# Patient Record
Sex: Female | Born: 1945
Health system: Southern US, Community
[De-identification: ages and names within clinical notes are randomized; demographics above are authoritative.]

## PROBLEM LIST (undated history)

## (undated) DIAGNOSIS — I10 Essential (primary) hypertension: Secondary | ICD-10-CM

## (undated) DIAGNOSIS — E785 Hyperlipidemia, unspecified: Secondary | ICD-10-CM

## (undated) HISTORY — DX: Hyperlipidemia, unspecified: E78.5

## (undated) HISTORY — DX: Essential (primary) hypertension: I10

---

## 1968-08-10 HISTORY — PX: GANGLION CYST EXCISION: SHX1691

## 1993-08-10 HISTORY — PX: OVARY SURGERY: SHX727

## 1993-08-10 HISTORY — PX: BLADDER SURGERY: SHX569

## 1998-04-22 ENCOUNTER — Other Ambulatory Visit: Admission: RE | Admit: 1998-04-22 | Discharge: 1998-04-22 | Payer: Self-pay | Admitting: Gynecology

## 1998-12-26 ENCOUNTER — Encounter: Payer: Self-pay | Admitting: Endocrinology

## 1998-12-26 ENCOUNTER — Emergency Department (HOSPITAL_COMMUNITY): Admission: EM | Admit: 1998-12-26 | Discharge: 1998-12-26 | Payer: Self-pay | Admitting: Endocrinology

## 1999-04-21 ENCOUNTER — Other Ambulatory Visit: Admission: RE | Admit: 1999-04-21 | Discharge: 1999-04-21 | Payer: Self-pay | Admitting: Gynecology

## 1999-07-14 ENCOUNTER — Encounter: Admission: RE | Admit: 1999-07-14 | Discharge: 1999-07-14 | Payer: Self-pay | Admitting: Gynecology

## 1999-07-14 ENCOUNTER — Encounter: Payer: Self-pay | Admitting: Gynecology

## 1999-11-24 ENCOUNTER — Other Ambulatory Visit: Admission: RE | Admit: 1999-11-24 | Discharge: 1999-11-24 | Payer: Self-pay | Admitting: Gynecology

## 2000-08-09 ENCOUNTER — Inpatient Hospital Stay (HOSPITAL_COMMUNITY): Admission: EM | Admit: 2000-08-09 | Discharge: 2000-08-11 | Payer: Self-pay | Admitting: Emergency Medicine

## 2000-08-09 ENCOUNTER — Encounter: Payer: Self-pay | Admitting: Emergency Medicine

## 2000-08-10 ENCOUNTER — Encounter: Payer: Self-pay | Admitting: Cardiology

## 2000-08-10 HISTORY — PX: ABDOMINAL HYSTERECTOMY: SHX81

## 2000-08-11 HISTORY — PX: CARDIAC CATHETERIZATION: SHX172

## 2000-11-15 ENCOUNTER — Other Ambulatory Visit: Admission: RE | Admit: 2000-11-15 | Discharge: 2000-11-15 | Payer: Self-pay | Admitting: Gynecology

## 2001-03-07 ENCOUNTER — Encounter: Payer: Self-pay | Admitting: Gastroenterology

## 2001-03-07 ENCOUNTER — Ambulatory Visit (HOSPITAL_COMMUNITY): Admission: RE | Admit: 2001-03-07 | Discharge: 2001-03-07 | Payer: Self-pay | Admitting: Gastroenterology

## 2001-03-14 ENCOUNTER — Encounter: Payer: Self-pay | Admitting: Gynecology

## 2001-03-14 ENCOUNTER — Encounter: Admission: RE | Admit: 2001-03-14 | Discharge: 2001-03-14 | Payer: Self-pay | Admitting: Gynecology

## 2001-04-04 ENCOUNTER — Ambulatory Visit (HOSPITAL_COMMUNITY): Admission: RE | Admit: 2001-04-04 | Discharge: 2001-04-04 | Payer: Self-pay | Admitting: Gastroenterology

## 2001-04-04 ENCOUNTER — Encounter (INDEPENDENT_AMBULATORY_CARE_PROVIDER_SITE_OTHER): Payer: Self-pay | Admitting: *Deleted

## 2001-04-27 ENCOUNTER — Encounter (INDEPENDENT_AMBULATORY_CARE_PROVIDER_SITE_OTHER): Payer: Self-pay | Admitting: *Deleted

## 2001-04-27 ENCOUNTER — Inpatient Hospital Stay (HOSPITAL_COMMUNITY): Admission: RE | Admit: 2001-04-27 | Discharge: 2001-04-30 | Payer: Self-pay | Admitting: Gynecology

## 2001-06-20 ENCOUNTER — Ambulatory Visit (HOSPITAL_COMMUNITY): Admission: RE | Admit: 2001-06-20 | Discharge: 2001-06-20 | Payer: Self-pay | Admitting: Gastroenterology

## 2001-06-20 ENCOUNTER — Encounter (INDEPENDENT_AMBULATORY_CARE_PROVIDER_SITE_OTHER): Payer: Self-pay | Admitting: Specialist

## 2002-01-30 ENCOUNTER — Other Ambulatory Visit: Admission: RE | Admit: 2002-01-30 | Discharge: 2002-01-30 | Payer: Self-pay | Admitting: Gynecology

## 2003-02-19 ENCOUNTER — Other Ambulatory Visit: Admission: RE | Admit: 2003-02-19 | Discharge: 2003-02-19 | Payer: Self-pay | Admitting: Gynecology

## 2003-04-23 ENCOUNTER — Other Ambulatory Visit: Admission: RE | Admit: 2003-04-23 | Discharge: 2003-04-23 | Payer: Self-pay | Admitting: Gynecology

## 2004-05-01 ENCOUNTER — Other Ambulatory Visit: Admission: RE | Admit: 2004-05-01 | Discharge: 2004-05-01 | Payer: Self-pay | Admitting: Gynecology

## 2004-05-12 ENCOUNTER — Encounter (INDEPENDENT_AMBULATORY_CARE_PROVIDER_SITE_OTHER): Payer: Self-pay | Admitting: *Deleted

## 2004-05-12 ENCOUNTER — Ambulatory Visit (HOSPITAL_COMMUNITY): Admission: RE | Admit: 2004-05-12 | Discharge: 2004-05-12 | Payer: Self-pay | Admitting: Gastroenterology

## 2005-03-02 ENCOUNTER — Other Ambulatory Visit: Admission: RE | Admit: 2005-03-02 | Discharge: 2005-03-02 | Payer: Self-pay | Admitting: Gynecology

## 2005-03-30 ENCOUNTER — Encounter: Admission: RE | Admit: 2005-03-30 | Discharge: 2005-03-30 | Payer: Self-pay | Admitting: Gynecology

## 2005-11-09 ENCOUNTER — Inpatient Hospital Stay (HOSPITAL_COMMUNITY): Admission: AD | Admit: 2005-11-09 | Discharge: 2005-11-09 | Payer: Self-pay | Admitting: Gynecology

## 2006-03-08 ENCOUNTER — Other Ambulatory Visit: Admission: RE | Admit: 2006-03-08 | Discharge: 2006-03-08 | Payer: Self-pay | Admitting: Gynecology

## 2006-04-18 ENCOUNTER — Emergency Department (HOSPITAL_COMMUNITY): Admission: EM | Admit: 2006-04-18 | Discharge: 2006-04-18 | Payer: Self-pay | Admitting: Emergency Medicine

## 2006-10-18 HISTORY — PX: NM MYOCAR PERF WALL MOTION: HXRAD629

## 2007-03-15 ENCOUNTER — Encounter: Admission: RE | Admit: 2007-03-15 | Discharge: 2007-03-15 | Payer: Self-pay | Admitting: Internal Medicine

## 2007-05-09 ENCOUNTER — Other Ambulatory Visit: Admission: RE | Admit: 2007-05-09 | Discharge: 2007-05-09 | Payer: Self-pay | Admitting: Gynecology

## 2008-05-21 ENCOUNTER — Encounter: Admission: RE | Admit: 2008-05-21 | Discharge: 2008-05-21 | Payer: Self-pay | Admitting: Gynecology

## 2009-12-06 ENCOUNTER — Observation Stay (HOSPITAL_COMMUNITY): Admission: EM | Admit: 2009-12-06 | Discharge: 2009-12-07 | Payer: Self-pay | Admitting: Emergency Medicine

## 2010-10-28 LAB — CK TOTAL AND CKMB (NOT AT ARMC)
Relative Index: 1.7 (ref 0.0–2.5)
Total CK: 117 U/L (ref 7–177)

## 2010-10-28 LAB — LIPID PANEL
Total CHOL/HDL Ratio: 3.7 RATIO
Triglycerides: 194 mg/dL — ABNORMAL HIGH (ref <150)
VLDL: 39 mg/dL (ref 0–40)

## 2010-10-28 LAB — BASIC METABOLIC PANEL
CO2: 24 mEq/L (ref 19–32)
Calcium: 9.4 mg/dL (ref 8.4–10.5)
GFR calc Af Amer: >60 mL/min (ref >60)
Glucose, Bld: 108 mg/dL — ABNORMAL HIGH (ref 70–99)
Potassium: 3.7 mEq/L (ref 3.5–5.1)
Sodium: 139 mEq/L (ref 135–145)

## 2010-10-28 LAB — COMPREHENSIVE METABOLIC PANEL
AST: 23 U/L (ref 0–37)
Calcium: 8.6 mg/dL (ref 8.4–10.5)
Creatinine, Ser: 0.99 mg/dL (ref 0.4–1.2)
GFR calc non Af Amer: 57 mL/min — ABNORMAL LOW (ref >60)
Potassium: 3.9 mEq/L (ref 3.5–5.1)
Sodium: 139 mEq/L (ref 135–145)
Total Protein: 7.3 g/dL (ref 6.0–8.3)

## 2010-10-28 LAB — HEPATIC FUNCTION PANEL
ALT: 22 U/L (ref 0–35)
Albumin: 3.9 g/dL (ref 3.5–5.2)
Indirect Bilirubin: 0.8 mg/dL (ref 0.3–0.9)
Total Protein: 6.7 g/dL (ref 6.0–8.3)

## 2010-10-28 LAB — CARDIAC PANEL(CRET KIN+CKTOT+MB+TROPI)
CK, MB: 1.3 ng/mL (ref 0.3–4.0)
CK, MB: 1.4 ng/mL (ref 0.3–4.0)
CK, MB: 1.5 ng/mL (ref 0.3–4.0)
Relative Index: 1 (ref 0.0–2.5)
Relative Index: 1.4 (ref 0.0–2.5)
Total CK: 128 U/L (ref 7–177)
Total CK: 131 U/L (ref 7–177)
Troponin I: 0.02 ng/mL (ref 0.00–0.06)
Troponin I: <0.01 ng/mL (ref 0.00–0.06)
Troponin I: <0.01 ng/mL (ref 0.00–0.06)

## 2010-10-28 LAB — CBC
Hemoglobin: 15 g/dL (ref 12.0–15.0)
Hemoglobin: 15 g/dL (ref 12.0–15.0)
MCHC: 35 g/dL (ref 30.0–36.0)
Platelets: 198 10*3/uL (ref 150–400)
RBC: 4.33 MIL/uL (ref 3.87–5.11)

## 2010-10-28 LAB — DIFFERENTIAL
Basophils Absolute: 0 10*3/uL (ref 0.0–0.1)
Basophils Relative: 1 % (ref 0–1)
Lymphs Abs: 2 10*3/uL (ref 0.7–4.0)
Monocytes Relative: 8 % (ref 3–12)

## 2010-10-28 LAB — POCT CARDIAC MARKERS
Myoglobin, poc: 92.1 ng/mL (ref 12–200)
Troponin i, poc: <0.05 ng/mL (ref 0.00–0.09)

## 2010-10-28 LAB — TROPONIN I: Troponin I: 0.01 ng/mL (ref 0.00–0.06)

## 2010-10-28 LAB — AMYLASE: Amylase: 45 U/L (ref 0–105)

## 2010-12-26 NOTE — Procedures (Signed)
[place]  Patient:    KRISTL, MORIOKA Visit Number: [id number] MRN: [id number]          Service Type: END Location: ENDO Attending Physician:  Charna Elizabeth Dictated by:   Anselmo Rod, M.D. Proc. Date: 04/04/01 Adm. Date:  04/04/2001   CC:         Ravi R. Felipa Eth, M.D.   Procedure Report  DATE OF BIRTH:  1945/12/20  PROCEDURE PERFORMED:  Esophagogastroduodenoscopy with biopsies.  ENDOSCOPIST:  Anselmo Rod, M.D.  INSTRUMENT USED:  Olympus video panendoscope  INDICATION FOR PROCEDURE:  Epigastric pain with a history of blood in the stool in a 65 year old white female rule out peptic ulcer, esophagitis, gastritis, etc.  PREPROCEDURE PREPARATION: Informed consent was procured from the patient. The patient was fasted for eight hours prior to the procedure.  PREPROCEDURE PHYSICAL:  VITAL SIGNS:  The patient had stable vital signs.  NECK: Supple.  CHEST: Clear to auscultation. S1 and S2 regular.  ABDOMEN:  Soft with normal bowel sounds.  Epigastric tenderness on palpation with guarding.  No rebound. No rigidity. No hepatosplenomegaly. No masses palpable.  DESCRIPTION OF PROCEDURE:  The patient was placed in the left lateral decubitus position and sedated with 50 mg of Demerol and 5 mg of Versed intravenously.  Once the patient was adequately sedated and maintained on low flow oxygen, continuous cardiac monitoring, the Olympus video panendoscope was advanced through the mouth piece over the tongue into the esophagus under direct vision.  The proximal esophagus appeared normal. There was slight irregularity of the Z line at the GE junction.  This was biopsied to rule out Barretts mucosa.  The rest of the gastric mucosa and the proximal small bowel appeared normal. There was mild duodenitis in the duodenal bulb. Small bowel appeared normal. There was no other obstruction.  The patient tolerated the procedure well without  complications.  Biopsies were done from the antrum to rule out presence of Helicobacter pylori by CLOtest.  IMPRESSION: 1. Irregular mucosa at Z line.  Biopsied. 2. Mild duodenitis in duodenal bulb. 3. CLOtest done. 4. Normal appearing gastric mucosa.  RECOMMENDATIONS: 1. Avoid all nonsteroidals. 2. Continue the use of PPIs. 3. Proceed with colonoscopy at this time. 4. Treat with antibiotics if CLOtest positive for H. pylori. Dictated by:   Anselmo Rod, M.D. Attending Physician:  Charna Elizabeth DD:  04/04/01 TD:  04/05/01 Job: [id number]

## 2010-12-26 NOTE — H&P (Signed)
[facility]  Patient:    Monica Cordova, Monica Cordova Visit Number: [id number] MRN: [id number]          Service Type: END Location: ENDO Attending Physician:  Charna Elizabeth Admit Date:  04/04/2001 Discharge Date: 04/04/2001   CC:         Feliciana Rossetti, M.D., [facility], [place]   History and Physical  CHIEF COMPLAINT:  Pelvic mass and pelvic support problems.  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  She had ultrasound examination for evaluation of pelvic fullness and was noted to have a complex mass compatible with hydrosalpinx on the contralateral side on her right.  She also has significant pelvic support problems with uterine decensus grade 2 with a large central fascial defect in the anterior vaginal wall with bilateral paravaginal defects and significant incontinence.  She also has severe posterior pelvic support problems with rectocele and enterocele and possible levator plate tears.  She has detachment of the perineal body from the levator fascia.  She is now admitted for exploratory laparotomy, right salpingo-oophorectomy paravaginal plus Burch, posterior and enterocele repairs.  She understands the risks and benefits of the procedure and alternative therapies.  She is now admitted for definitive therapy.  I have answered her questions regarding the procedure itself, recovery, risks, and complications.  Her Pap smears have been within normal limits, most recent April 2002.  PAST MEDICAL HISTORY:  Usual childhood disease without sequelae.  She also has hypothyroidism on replacement.  She has known hypercholesterolemia on therapy with Lipitor.  She is on hormone replacement therapy with Vivelle-Dot and Prometrium.  HABITS:  Tobacco 1 pack-per-day.  Social ethanol.  Denies recreational drugs.  FAMILY HISTORY:  Father died age 3 of  congestive heart failure and hypertension.  Mother is living with asthma.  One brother had carcinoma of the tongue.  Maternal grandmother and maternal grandfather both died of MI.  No other known familial tendency.  REVIEW OF SYSTEMS:  HEENT:  Denies symptoms.  CARDIORESPIRATORY:  Chronic cough.  She has a history of hospitalization December 2001 for an abnormal EKG.  Had heart cath and Cardiolite stress test within normal limits. GI/GU:  Has reflux on Protonix daily.  PRESENT MEDICATIONS: 1. Protonix 40 mg b.i.d. 2. Synthroid 0.5 mg daily. 3. Vivelle-Dot 0.05. 4. Prometrium 200 mg 1-12 each month. 5. Lipitor 20 mg daily.  ALLERGIES:  CODEINE, ANTIHISTAMINES.  PHYSICAL EXAMINATION:  GENERAL:  Well-developed but short and obese white female with high abdomen to hip ratio.  HEENT:  Pupils, equal, round, reactive to light and accommodation.  Fundi not examined.  Oropharynx clear.  NECK:  Supple without mass or thyroid enlargement.  CHEST:  Clear to P&A.  HEART:  Regular without murmur or cardiac enlargement.  BREASTS:  Soft without mass, nodes, or nipple discharge.  ABDOMEN:  Large panniculus without mass or organomegaly.  PELVIC:  External genitalia normal female.  Vagina clean, rugose.  She has extremely poor pelvic support with central fascial defect and bilateral paravaginal weakness.  She also has an enormous enterocele and rectocele with extensive levator separation and detachment of the perineal body.  Her anus remains intact.  Cervix descends to near the introitus.  The uterus is upper limits of normal size.  There is right adnexal fullness that is difficult to outline because of abdominal panniculus.  Rectovaginal exam confirms.  EXTREMITIES:  Negative.  NEUROLOGIC:  Physiologic.  IMPRESSION: 1. Right adnexal mass believed to be right hydrosalpinx, rule out complex    adnexal. 2. Pelvic support problems with uterine decensus, grade 3 cystocele, rectocele     and enterocele with perineal body detachment. 3. Hypothyroidism, on replacement. 4. Dyslipidemia, on Lipitor. 5. Gastroesophageal reflux disease, on Protonix.  PLAN:  Exploratory laparotomy, total abdominal hysterectomy, right salpingo-oophorectomy, paravaginal plus Burch, repair of central fascial defect anterior vaginal wall, posterior and enterocele repairs. Attending Physician:  Charna Elizabeth DD:  04/26/01 TD:  04/27/01 Job: [id number]

## 2010-12-26 NOTE — Op Note (Signed)
NAMEDORE, OQUIN            ACCOUNT NO.:  [id number]   MEDICAL RECORD NO.:  [id number]          PATIENT TYPE:  AMB   LOCATION:  ENDO                         FACILITY:  [facility]   PHYSICIAN:  Anselmo Rod, M.D.  DATE OF BIRTH:  1946-03-02   DATE OF PROCEDURE:  05/12/2004  DATE OF DISCHARGE:                                 OPERATIVE REPORT   PROCEDURE PERFORMED:  Colonoscopy with biopsies.   ENDOSCOPIST:  Charna Elizabeth, M.D.   INSTRUMENT USED:  Olympus video colonoscope.   INDICATIONS FOR PROCEDURE:  Personal history of colonic polyps in a 65-year-  old white female with history of recurrent bleeding.  Rule out polyps,  masses, etc.   PREPROCEDURE PREPARATION:  Informed consent was procured from the patient.  The patient was fasted for eight hours prior to the procedure and prepped  with a bottle of magnesium citrate and a gallon of GoLYTELY the night prior  to the procedure.   PREPROCEDURE PHYSICAL:  The patient had stable vital signs.  Neck supple.  Chest clear to auscultation.  S1 and S2 regular.  No murmurs, rubs or  gallops, rales, rhonchi or wheezing.  Abdomen soft with normal bowel sounds.   DESCRIPTION OF PROCEDURE:  The patient was placed in left lateral decubitus  position and sedated with an additional 40 mg of Demerol and 4 mg of Versed  in slow incremental doses.  Once the patient was adequately sedated and  maintained on low flow oxygen and continuous cardiac monitoring, the Olympus  video colonoscope was advanced from the rectum to the cecum, small internal  hemorrhoids were seen on retroflexion.  Small sessile polyps were biopsied  from the rectosigmoid times five (cold biopsies).  The rest of the colonic  up to the cecum appeared normal.  The appendicular orifice and ileocecal  valve were clearly visualized and photographed.   IMPRESSION:  1.  Small internal hemorrhoids.  2.  Small sessile polyps biopsied from the rectosigmoid colon times five.  3.   Otherwise unrevealing colonoscopy.   RECOMMENDATIONS:  1.  Await pathology results.  2.  Avoid all nonsteroidals including aspirin for the next two weeks.  3.  Outpatient followup in the next two weeks or earlier if need be.       JNM/MEDQ  D:  05/12/2004  T:  05/12/2004  Job:  [id number]   cc:   Larina Earthly, M.D.  [address]  [place]  [place] [address]  Fax: [phone]

## 2010-12-26 NOTE — Discharge Summary (Signed)
[place]  Patient:    Monica Cordova, Monica Cordova                   MRN: [id number] Adm. Date:  [id number] Disc. Date: [id number] Attending:  Ophelia Shoulder Dictator:   Mancel Bale, P.A. CC:         Feliciana Rossetti, M.D.  Lenise Herald, M.D.   Discharge Summary  ADMISSION DIAGNOSES:  1. Atypical chest pain.  2. Hyperlipidemia.  3. Tobacco abuse.  4. Hypothyroidism on treatment.  5. Post menopausal.  6. History of urethral abscess with investigational new drug.  7. Status post right oophorectomy.  8. History of recent oral surgery on July 19, 2000.  9. History of bone spur removal and ganglion cyst excision. 10. Family history for cardiovascular disease of a father who had his first     myocardial infarction at age 59.  DISCHARGE DIAGNOSES:  1. Atypical chest pain.  2. Hyperlipidemia.  3. Tobacco abuse.  4. Hypothyroidism on treatment.  5. Post menopausal.  6. History of urethral abscess with investigational new drug.  7. Status post right oophorectomy.  8. History of recent oral surgery on July 19, 2000.  9. History of bone spur removal and ganglion cyst excision. 10. Family history for cardiovascular disease of a father who had his first     myocardial infarction at age 52. 11. Status post cardiac catheterization August 11, 2000 by Dr. Lenise Herald. LV gram showed 45-50% ejection fraction mildly dilated.  Mild     global hypokinesis.  His dictated cath report is not available, so please     see that for further detail.  The LAD and the left circumflex themselves     were without significant disease.  Some of the branches had some minimal     irregularities, otherwise, normal.  However, the RCA had a mid 50%     stenosis followed by a more distal 30% stenosis.  This 50% stenosis was in     an area of a kink and therefore, was not intervened upon.      At that time, Dr. Jenne Campus planned for her to stop Plavix.  He  used a 4     [place] catheter.  Therefore, she only requires three hours of bed rest.     If her groin is stable at that time, then she will be discharged home an     outpatient Cardiolite to assess the RCA territory.  [place] CONSULTS:  None.  [place] PROCEDURES: Cardiac catheterization performed on August 11, 2000 by Dr. Lenise Herald revealing essentially normal coronaries other than a mid 50% RCA lesion.  LV gram showed EF 45-50% mildly dilated.  Mild global hypokinesis.  Please see his dictated cath report for further details as it is unavailable at this time.  No intervention was performed.  She is planned for three hours of bedrest and then discharged home stable.  [facility] LABORATORIES AND ACCESSORY DATA:  CBC on August 11, 2000 reveals WBC 7.3, hemoglobin 13.1, hematocrit 37.3, platelets 251,000.  This was stable throughout the hospitalization.  Metabolic profile reveals sodium 140, potassium 3.7, BUN 16, creatinine 1.0, glucose 98.  This also was normal throughout the hospitalization.  On August 08, 2000, PT 13.3, INR 1.1, PTT was greater than 200 on Heparin.  LFTs are all normal with AST 23, ALT 23, alkaline phosphatase 84, total bilirubin 0.9, amylase 78, lipase 30.  Cardiac enzymes are negative  with CKs 102, 99 and 89.  MBs are 1.2, 1.9 and 0.8. Troponin is less than 0.01.  TSH is 1.120.  Radiology chest x-ray on August 08, 2000 shows no active disease.  EKG on admission revealed normal sinus rhythm with Q waves in V1 through V3.  DISCHARGE MEDICATIONS: 1. Enteric coated aspirin 325 mg once a day. 2. Synthroid 50 mcg once a day. 3. Lipitor 20 mg once a day. 4. Prometrium 200 mg as before. 5. Vivelle patch as before. 6. Doxycycline 100 mg 1 p.o. q d for 20 days. 7. Axid 100 mg 1 p.o. q d p.r.n.  DISCHARGE ACTIVITY:  No strenuous activity, lifting greater than 5 pounds, driving or sexual activity for three days.  DIET: Low salt, low fat, low cholesterol  diet.  WOUND CARE:  Make sure to wash the groin with warm water and soap.  DISCHARGE INSTRUCTIONS:  Call the office at [phone] if any bleeding or increased size or pain of the groin.  FOLLOW-UP: 1. She is scheduled for an exercise Cardiolite performed in our office    January 15 at 8:30 a.m.  This is to follow-up for any ischemia that may be    coming from the RCA territory with a 50% mid lesion. 2. Follow-up with Dr. Jenne Campus in the office January 25 at 2:10 p.m. DD:  08/12/99 TD:  08/11/00 Job: [id number]

## 2010-12-26 NOTE — Discharge Summary (Signed)
[facility]  Patient:    Monica Cordova, Monica Cordova Visit Number: [id number] MRN: [id number]          Service Type: Attending:  Gretta Cool, M.D. Dictated by:   Jeani Sow, F.N.P. Adm. Date:  04/27/01 Disc. Date: 04/30/01   CC:         Tinnie Gens C. Quintella Reichert, M.D.   Discharge Summary  HISTORY OF PRESENT ILLNESS:  Ms. Monica Cordova is a 65 year old, white female, G2, P1, AB1 with a history of hydrosalpinx treated in December 1995, by Dr. Phyllis Ginger.  She had ultrasound examination for evaluation of pelvic fullness and was noted to have a complex mass compatible with hydrosalpinx on the contralateral side, her right.  She also has significant pelvic support problems with uterine descensus grade 2 with a large central facial defect in the anterior vaginal wall with bilateral paravaginal defects and significant urinary incontinence.  She has had severe posterior pelvic support problems with rectocele and enterocele and possible levator plate tears.  She has attachment of the perineal body from the levator fascia.  She is now admitted for exploratory laparotomy, right salpingo-oophorectomy, paravaginal plus Burch, posterior and enterocele repairs.  Risks and benefits have been discussed with the patient and she accepts these procedures.  PHYSICAL EXAMINATION:  CHEST:  Clear to auscultation and percussion.  HEART:  Regular rate and rhythm without murmur, gallop or cardiac enlargement.  ABDOMEN:  Large panniculus without masses or organomegaly.  PELVIC:  External genitalia within normal limits for female.  Vagina clean and rugose.  Poor pelvic support with central fascial defect and bilateral paravaginal weakness.  She has an enormous enterocele and rectocele with extensive levator separation and detachment of the perineal body.  The anus remains intact.  The cervix descends near the introitus.  The uterus is upper normal limits of size and there is right adnexal  fullness that is difficult to outline due to the panniculus.  Rectovaginal exam confirms.  IMPRESSION: 1. Right adnexal mass believed to be right hydrosalpinx, rule out complex    adnexal mass. 2. Pelvic support problems with uterine descensus, grade 3 cystocele,    rectocele and enterocele with perineal body detachment. 3. Hypothyroidism on replacement. 4. Dyslipidemia on Lipitor. 5. Gastroesophageal reflux disease on Protonix.  PLAN:  Exploratory laparotomy, total abdominal hysterectomy, right salpingo-oophorectomy, paravaginal plus Burch repair of central fascial defect of anterior vaginal wall and posterior enterocele repairs under general anesthesia.  Risks and benefits were discussed with the patient and she accepts these procedures.  LABORATORY DATA AND X-RAY FINDINGS:  Admission hemoglobin 14.0, hematocrit 39.5.  On postop day #1, hemoglobin 12.1, hematocrit 34.3 and white count of 7.3.  HOSPITAL COURSE:  The patient underwent the above-named procedures under general anesthesia.  She tolerated the procedures well and was returned to the recovery room in excellent condition.  A catheter was placed with approximately 400 cc distending the bladder and secured in place.  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  Endometrium with proliferative phase with no evidence of hyperplasia.  Myometrium with leiomyomata measuring up to 0.8 cm.  Her postoperative course was without complications and she was discharged on postop day #3 in excellent condition.  ACTIVITY:  No heavy lifting or straining.  No vaginal entrance.  Increase ambulation as tolerated.  SPECIAL INSTRUCTIONS:  She is to call for any fever of over 100.5 or failure of daily improvement.  DIET:  Regular.  DISCHARGE MEDICATIONS: 1. Tylox one p.o. q.4h. p.r.n. discomfort.  2. Vioxx 25 mg daily. 3. Medications given previously to be continued.  DISCHARGE DIAGNOSES: 1. Right adnexal mass,  complex and stable over time, probably hydrosalpinx.    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 2. Stress urinary incontinence, mixed pattern predominantly stressed. 3. Severe pelvic support problems including uterine prolapse, enterocele,    cystocele and rectocele grade 3.  PROCEDURES:  Total abdominal hysterectomy, right salpingo-oophorectomy, paravaginal plus Burch, posterior and enterocele repairs under general anesthesia. Dictated by:   Jeani Sow, F.N.P. Attending:  Gretta Cool, M.D. DD:  05/16/01 TD:  05/16/01 Job: [id number]

## 2010-12-26 NOTE — Op Note (Signed)
[place]. [facility]  Patient:    Monica Cordova, Monica Cordova                   MRN: [id number] Proc. Date: 08/11/00 Adm. Date:  [id number] Attending:  Ophelia Shoulder CC:         Cardiac Catheterization Laboratory  Madaline Savage, M.D.  Lacretia Leigh. Quintella Reichert, M.D.   Operative Report  PROCEDURE: 1. Left heart catheterization. 2. Coronary angiography. 3. Left ventriculogram. 4. Abdominal aortogram.  CARDIOLOGIST:  Lenise Herald, M.D.  COMPLICATIONS:  None.  INDICATIONS:  Ms. Emanuele is a 65 year old white female, a patient of Dr. Tinnie Gens C. Hoopers, with a history of tobacco abuse and hyperlipidemia, who was admitted on August 09, 2000, with atypical chest pain which was nitroglycerin responsive.  An electrocardiogram did reveal evidence of anteroseptal P-waves.  She is now referred for a cardiac catheterization to define her coronary anatomy.  DESCRIPTION OF PROCEDURE:  After giving an informed written consent, the patient is brought to the cardiac catheterization laboratory where the right and left groins are shaved, prepped, and draped in a normal sterile fashion. Electrocardiogram monitoring was established.  Using the modified Seldinger technique, a 4-French arterial sheath was inserted into the right femoral artery.  The 4-French diagnostic catheters were then used to perform diagnostic angiography.  RESULTS: 1. Left coronary artery:  This revealed a medium-sized left main, with    no significant disease. 2. Left anterior descending coronary artery:  The LAD is a medium-sized vessel    which coursed to the apex, and gave rise to two diagonal branches.  The LAD    has no significant disease.  The first diagonal is a small vessel, which is    irregular.  The second diagonal is a medium sized vessel with no    significant disease.  The left coronary artery also gives rise to a    medium-sized ramus intermedius with no significant  disease. 3. Left circumflex coronary artery:  The left circumflex is a medium    sized vessel which coursed in the AV groove and gave rise to two obtuse    marginal branches.  The AV groove circumflex has no significant disease.    The first OM is a small vessel, with no significant disease.  The second    OM is a medium sized vessel which bifurcates in its proximal portion.    It is irregular in the lower branch of the bifurcation, but has no    high-grade lesion. 4. Right coronary artery:  The right coronary artery is a large vessel which    is dominant and gives rise to a PDA, as well as a posterolateral branch.    There is a 50% mid-RCA lesion and a 30% early distal stenotic lesion.    The PDA and posterolateral branch have no significant disease.  LEFT VENTRICULOGRAM:  The left ventriculogram reveals mild LV dilation with mildly depressed ejection fraction of 45%-50%.  There is mild global hypokinesis.  ABDOMINAL AORTOGRAM:  An abdominal aortogram reveals no evidence of renal artery stenosis.  HEMODYNAMICS: Systemic arterial pressure:  134/62. LV systemic pressure:  134/16. LVEDP:  20.  CONCLUSION: 1. No significant coronary artery disease. 2. Mildly depressed left ventricular systolic function. 3. No evidence of renal artery stenosis. DD:  08/11/00 TD:  08/11/00 Job: [id number]

## 2010-12-26 NOTE — Procedures (Signed)
[place]. [facility]  Patient:    Monica Cordova, Monica Cordova [facility] Visit Number: [id number] MRN: [id number]          Service Type: END Location: ENDO Attending Physician:  Charna Elizabeth Dictated by:   Anselmo Rod, M.D. Proc. Date: 06/20/01 Admit Date:  06/20/2001   CC:         Ravi R. Felipa Eth, M.D.   Procedure Report  DATE OF BIRTH:  Jan 04, 1946  REFERRING PHYSICIAN:  Lennon Alstrom. Felipa Eth, M.D.  PROCEDURE PERFORMED:  Esophagogastroduodenoscopy with biopsies.  ENDOSCOPIST:  Anselmo Rod, M.D.  INSTRUMENT USED:  Olympus video panendoscope.  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  There was some inflammation seen on those biopsies therefore repeat EGD is being done after appropriate treatment to rebiopsy the esophagus and rule out dysplasia.  PREPROCEDURE PREPARATION:  Informed consent was procured from the patient. The patient was fasted for eight hours prior to the procedure.  PREPROCEDURE PHYSICAL:  The patient had stable vital signs.  Neck supple. Chest clear to auscultation.  S1, S2 regular.  Abdomen soft with normal abdominal bowel sounds.  DESCRIPTION OF PROCEDURE:  The patient was placed in left lateral decubitus position and sedated with 70 mg of Demerol and 7 mg of Versed intravenously. Once the patient was adequately sedated and maintained on low-flow oxygen and continuous cardiac monitoring, the Olympus video panendoscope was advanced through the mouthpiece, over the tongue, into the esophagus under direct vision.  The proximal esophagus appeared normal.  There was a small patch of salmon pink mucosa above the Z-line consistent with Barretts mucosa.  This was rebiopsied for pathology.  There was mild atrophic gastritis of the gastric mucosa.  No ulcers, erosions or masses were seen.  There was mild duodenitis in the duodenal bulb.  The small  bowel distal to the the bulb appeared normal.  The patient tolerated the procedure well without complications.  IMPRESSION: 1. 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 2. A mild diffuse gastritis. 3. Mild duodenitis in the duodenal bulb.  RECOMMENDATION: 1. Continue PPIs. 2. Await pathology results. 3. Outpatient follow-up in the next two weeks. Dictated by:   Anselmo Rod, M.D. Attending Physician:  Charna Elizabeth DD:  06/20/01 TD:  06/21/01 Job: [id number]

## 2010-12-26 NOTE — Op Note (Signed)
[facility]  Patient:    Monica Cordova, Monica Cordova Visit Number: [id number] MRN: [id number]          Service Type: Attending:  Gretta Cool, M.D. Dictated by:   Gretta Cool, M.D. Proc. Date: 04/27/01                             Operative Report  PREOPERATIVE DIAGNOSES: 1. 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 2. Stress urinary incontinence, mixed pattern, predominantly stress. 3. Severe pelvic support problems including uterine prolapse, enterocele,    rectocele, cystocele, enterocele, rectocele grade 3.  SURGEON:  Gretta Cool, M.D.  ANESTHESIA:  General orotracheal.  ASSISTANT:  Raynald Kemp, M.D.  DESCRIPTION OF PROCEDURE:  Under excellent general anesthesia with the patients abdomen prepped and draped as a sterile field with a Foley catheter draining her bladder, a vertical skin incision was made and extended through the fascia. She had had a previous vertical incision for hydrosalpinx many years earlier. The incision was then extended through the fascia and the peritoneum opened and the abdomen explored. There were no abnormalities identified in the upper abdomen, except that both kidneys were lobulated and somewhat small. Her appendix was not removed. Examination of her pelvis revealed a cystic ovarian mass deep in the pelvis. Her uterus was small and virtually delivering through the introitus. She had extensive adhesions of omentum to the anterior abdominal wall and rectosigmoid to the lateral pelvic wall and to the pelvic organs. Those adhesions were lysed by blunt and sharp dissection. She then underwent total abdominal hysterectomy progressively as follows:  Her uterus was elevated with the [facility] clamps. The round ligaments were then transected by electrocautery. The anterior leaf of the broad ligament was then opened and the bladder pushed off the lower segment. Her left tube  and ovary had been previously removed. The round ligament also had been previously removed with the hydrosalpinx. The infundibulopelvic vessels were then isolated on the right, clamped, cut, sutured, and tied with 0 Vicryl. The second free tie was used to doubly ligate that pedicle. The uterine vessels were then clamped, cut, sutured, and tied with 0 Vicryl. The cardinal and uterosacral ligaments were then progressively clamped, cut, sutured, and tied with 0 Vicryl. At this point, the uterosacral ligaments were identified with tag sutures using 0 Ethibond so as to identify them later for support and suspension of the cuff. The cervix was then excised. The cuff was then closed with running suture of 0 Vicryl. The cul-de-sac was then dissected and the uterosacral ligaments exposed. The tagged uterosacrals were then plicated as far posteriorly as possible and then secured to the posterior vaginal cuff to the uterosacral and cardinal ligaments and to the anterior vaginal cuff. The sutures were then tied so as to suspend the vagina to the uterosacral/cardinal complex high and posteriorly. At this point, careful examination of the pelvis revealed no evidence of significant bleeding. No evidence of compromise of ureters or other structures. The center of the pelvis was then reperitonealized with interrupted sutures of 2-0 Monocryl. At this point, the packs and retractors were removed. The abdominal peritoneum was then closed with a running suture of 0 Monocryl. At this point, attention was turned to the paravaginal and Burch procedures. The retropubic space was dissected and cleared of adventitial structure. The fascia was exposed and the defects identified in the paravaginal fascia. The  paravaginal tissue was then plicated by interrupted sutures of 0 Ethibond to the white line fascia of the pelvis from the apex of the vaginal cuff to a centimeter or so below the ischial spines all the way to the  symphysis. The anterior vaginal vault was elevated into high retropubic position with urethral orifice extending at least 0.5 to 1 cm behind the pubis symphysis. The Burch sutures were then placed so as to further suspend the vesical neck. Again, 0 Ethibond was used. At this point, the retropubic space was irrigated. There was no significant bleeding. The procedure was terminated without complication and the abdominal rectus and fascia closed with a running suture of 0 PDS from each angle to the midline. Subcutaneous tissue was then approximated with interrupted sutures of 3-0 Vicryl. A subcuticular closure was then done intermittently across the incision so as to prevent inversion of the incision when staples were applied. Staples were then also applied and Steri-Strips. The patient was then repositioned and redraped for the vaginal portion of the procedure. At this point, the vaginal mucosa was grasped with Allis clamps and a v-shaped incision made so as to excise the mucosa at the introitus. The mucosa was then undermined to the apex of the vaginal cuff. Approximately halfway up the vagina an enormous enterocele was encountered with enterocele sac bulging halfway down the vagina. At this point, the enormous defect and fascia was easily visible. The fascia from the apex of the vaginal cuff had separated off at least the upper two-thirds of the vagina. The cardinal and uterosacral plication was then identified posteriorly and the fascia was secured to the apex of the vaginal cuff and to the uterosacral by a series of interrupted sutures of 0 Vicryl. Next, the perirectal fascia was plicated in the midline from the apex of the vaginal cuff including suture to the cuff all the way to the introitus. The entire perirectal fascia of the defect was so repaired. The upper layers of endopelvic fascia and the mucosa were then closed with a running subcuticular closure of 2-0 Vicryl ______ needle. The  perineal body was then repaired including bulbocavernosus. At this point, the vagina wasmade adequate by digital caliber and with excellent depth and very well repaired  cystocele, rectocele, and enterocele. At the end of the procedure, the sponge and lap counts were correct. At this point, a Bonnano suprapubic Cystocath was placed with approximately 400 cc distending the bladder and secured in place with 0 Ethibond. At the end of the procedure, sponge and lap counts were correct. There were no complications. The patient returned to the recovery room in excellent condition. Dictated by:   Gretta Cool, M.D. Attending:  Gretta Cool, M.D. DD:  04/27/01 TD:  04/27/01 Job: [id number]

## 2010-12-26 NOTE — Op Note (Signed)
Monica Cordova, Monica Cordova            ACCOUNT NO.:  [id number]   MEDICAL RECORD NO.:  [id number]          PATIENT TYPE:  AMB   LOCATION:  ENDO                         FACILITY:  [facility]   PHYSICIAN:  Anselmo Rod, M.D.  DATE OF BIRTH:  Oct 26, 1945   DATE OF PROCEDURE:  05/12/2004  DATE OF DISCHARGE:                                 OPERATIVE REPORT   PROCEDURE:  Esophagogastroduodenoscopy.   ENDOSCOPIST:  Anselmo Rod, M.D.   INSTRUMENT USED:  Olympus video panendoscope.   INDICATIONS FOR PROCEDURE:  A 65 year old white female with a history of  Barrett's mucosa, undergoing an EGD for repeat surveillance.  Rule out  dysplasia.   PRE-PROCEDURE PREPARATION:  An informed consent was procured from the  patient.  The patient was fasted for eight hours prior to the procedure.   PRE-PROCEDURE PHYSICAL EXAMINATION:  VITAL SIGNS:  Stable.  NECK:  Supple.  CHEST:  Clear to auscultation.  HEART:  S1, S2 regular.  ABDOMEN:  Soft, with normal bowel sounds.   DESCRIPTION OF PROCEDURE:  The patient was placed in the left lateral  decubitus position and sedated with 60 mg of Demerol and 6 mg of Versed in  slow incremental doses.  Once the patient was adequately sedated and  maintained on low-flow oxygen and continuous cardiac monitoring, the Olympus  video panendoscope was advanced through the mouth piece, over the tongue,  into the esophagus on direct vision.  The entire esophagus appeared normal,  with no evidence of rings, strictures, masses, esophagitis, or Barrett's  mucosa.  The scope was then advanced into the stomach.  A small hiatal  hernia was seen on high retroflexion.  There was mild antral gastritis.  The  rest of the gastric mucosa appeared healthy, including the proximal small  bowel.  No esophageal biopsies were done, as no Barrett's mucosa was  identified.   IMPRESSION:  1.  Normal-appearing esophagus.  2.  Small hiatal hernia.  3.  Mild antral gastritis.  4.  Normal  proximal small bowel.   RECOMMENDATIONS:  1.  Continue Aciphex for now.  2.  Avoid smoking.  3.  Avoid non-steroidals.  4.  Proceed with a colonoscopy at this time.  5.  Further recommendations will be made after the colonoscopy has been      done.       JNM/MEDQ  D:  05/12/2004  T:  05/12/2004  Job:  [id number]   cc:   Larina Earthly, M.D.  [address]  [place]  [place] [address]  Fax: [phone]

## 2010-12-26 NOTE — Procedures (Signed)
[place]  Patient:    [name], [name] Visit Number: [id number] MRN: [id number]          Service Type: END Location: ENDO Attending Physician:  Charna Elizabeth Proc. Date: 04/04/01 Adm. Date:  04/04/2001   CC:         Ravi R. Felipa Eth, M.D.   Procedure Report  DATE OF BIRTH:  07-18-1946  REFERRING PHYSICIAN:  Lennon Alstrom. Felipa Eth, M.D.  PROCEDURE PERFORMED:  Colonoscopy with cold biopsy x 4, hot biopsy x 6 and snare polypectomy x 1.  ENDOSCOPIST:  Anselmo Rod, M.D.  INSTRUMENT USED:  Olympus pediatric video colonoscope.  INDICATIONS FOR PROCEDURE:  Blood in stool in a 65 year old white female Rule out colonic polyps, masses, hemorrhoids, etc.  PREPROCEDURE PREPARATION:  Informed consent was procured from the patient. The patient was fasted for eight hours prior to the procedure and prepped with a bottle of magnesium citrate and a gallon of NuLytely the night prior to the procedure.  PREPROCEDURE PHYSICAL:  The patient had stable vital signs.  Neck supple. Chest clear to auscultation.  S1, S2 regular.  No murmurs, gallops or rubs, rales, ronchi or wheezes.  Abdomen soft with epigastric tenderness on palpation. No guarding, no rebound, no rigidity, no hepatosplenomegaly.  DESCRIPTION OF PROCEDURE:  The patient was placed in the left lateral decubitus position and sedated with an additional 25 mg of Demerol and  2.5 mg of Versed intravenously.  Once the patient was adequately sedated and maintained on low-flow oxygen and continuous cardiac monitoring, the Olympus video colonoscope was advanced from the rectum to the cecum without difficulty.  The patient had a fairly good prep.  A small polypoid flat lesions was seen at the hepatic flexure with an erythematous center.  This was biopsied for pathology.  Cold biopsies were done and placed in bottle #1.  Two flat small 5 mm polyps were snared at about 60 cm.  Six sessile  hyperplastic appearing polyps removed by hot biopsy from the left colon.  Small internal and external hemorrhoid were seen on retroflexion and anal inspection respectively.  The patient tolerated the procedure well without complication.  IMPRESSION: 1. Multiple colonic polyps as described above.  Biopsies done. Results    pending. 2. Small internal and external hemorrhoids.  RECOMMENDATIONS:  Await pathology results.  Avoid all nonsteroidals for now. Check CBC today.  Outpatient follow-up in the next two weeks.Attending Physician:  Charna Elizabeth DD:  04/04/01 TD:  04/05/01 Job: [id number]

## 2011-03-08 IMAGING — CT CT ANGIO CHEST
2 of 6 series · 19 of 36 positions shown · IV contrast (APPLIED)
Comparison: None.

CLINICAL DATA: Mid to right chest pain.  Diaphoresis.

CT ANGIOGRAPHY CHEST WITH CONTRAST
TECHNIQUE: Multidetector CT imaging of the chest was performed
using the standard protocol during bolus administration of
intravenous contrast.  Multiplanar CT image reconstructions
including MIPs were obtained to evaluate the vascular anatomy.
Contrast:  100 ml 3mnipaque-2CC

[Series 9: pulm embolism 1.0 b25f thins · axial · 0.63mm/px · z∈[-288,-54]mm · 18 of 261 slices shown]
[im 14/261  lung]
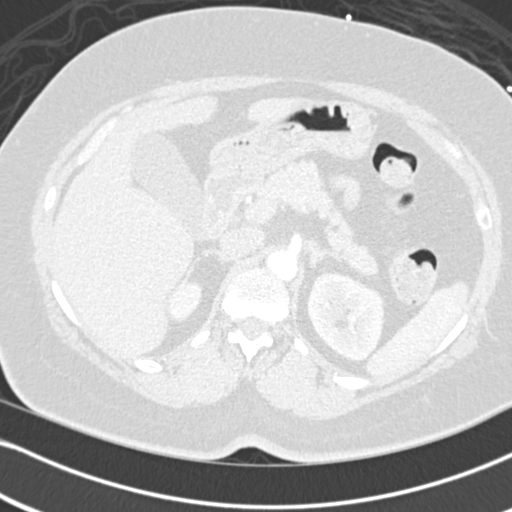
[im 27/261  mediastinal]
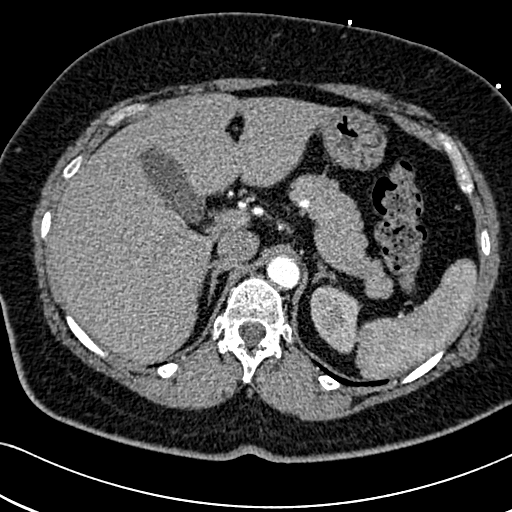
[im 40/261  lung]
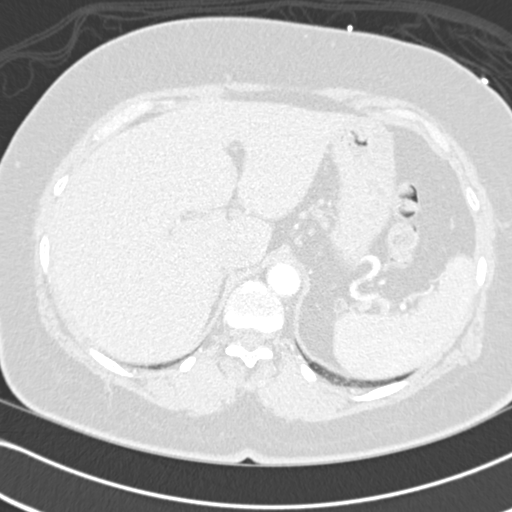
[im 53/261  mediastinal]
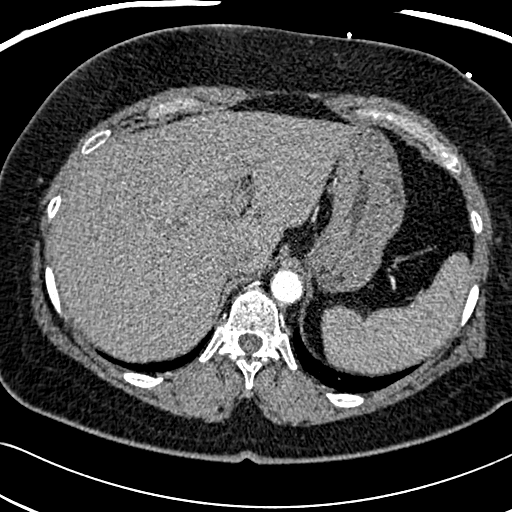
[im 66/261  lung]
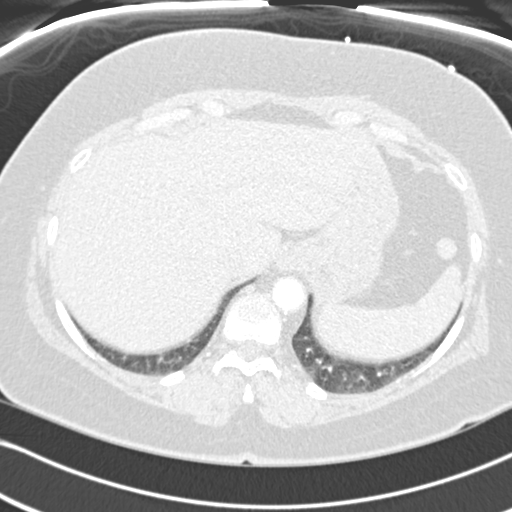
[im 79/261  mediastinal]
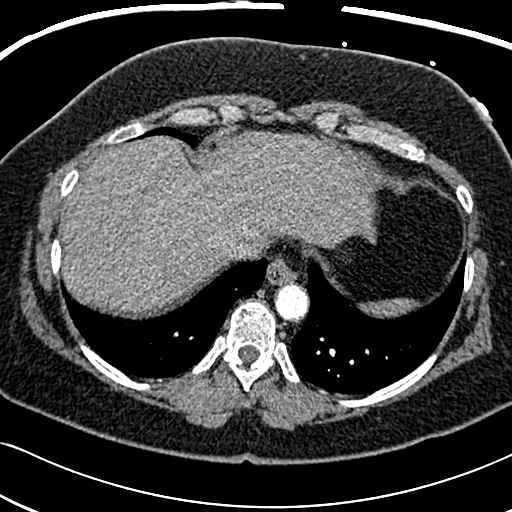
[im 92/261  lung]
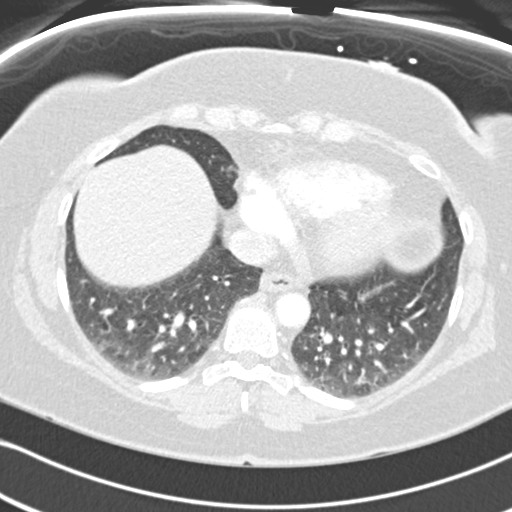
[im 105/261  mediastinal]
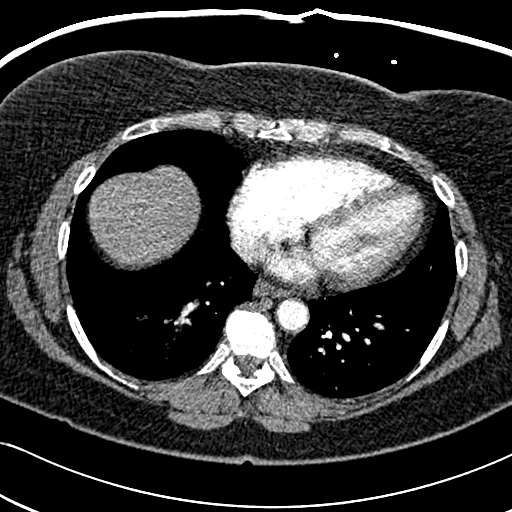
[im 118/261  lung]
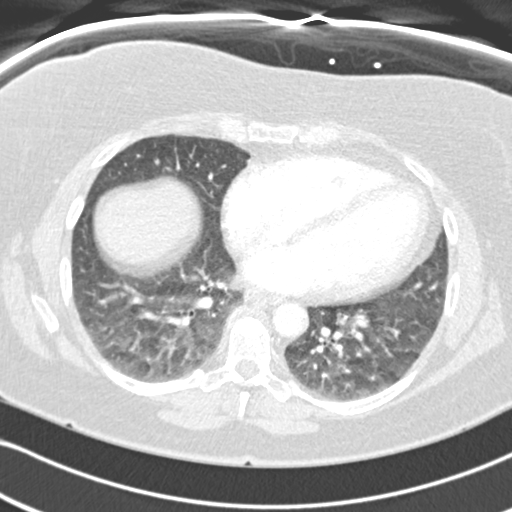
[im 144/261  mediastinal]
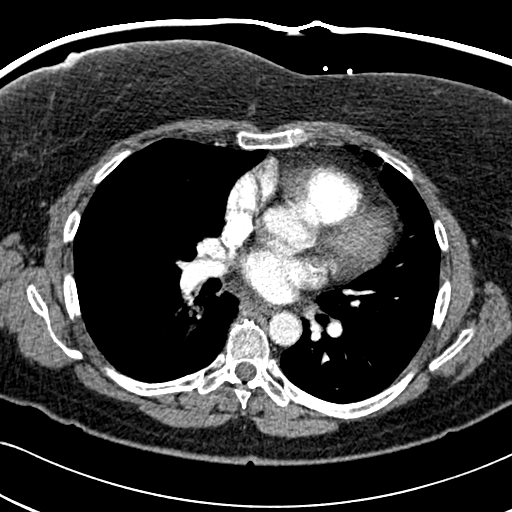
[im 157/261  lung]
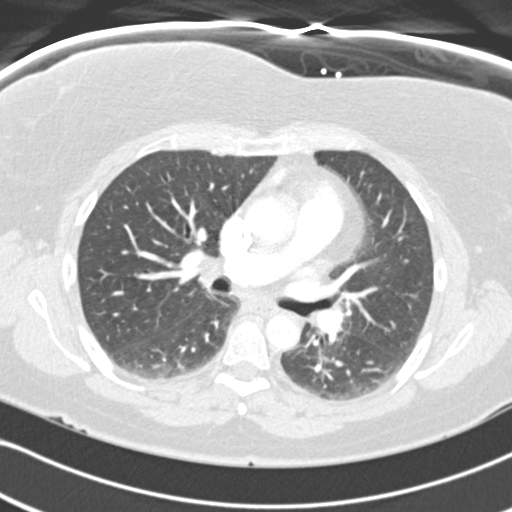
[im 170/261  mediastinal]
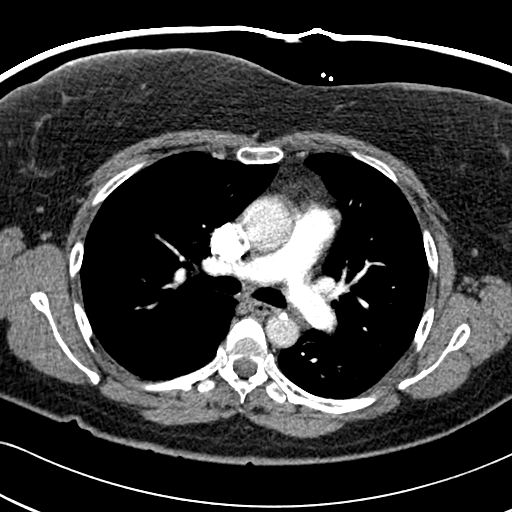
[im 183/261  lung]
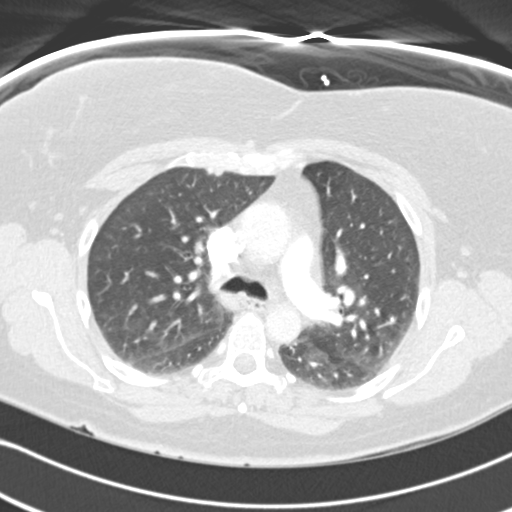
[im 196/261  mediastinal]
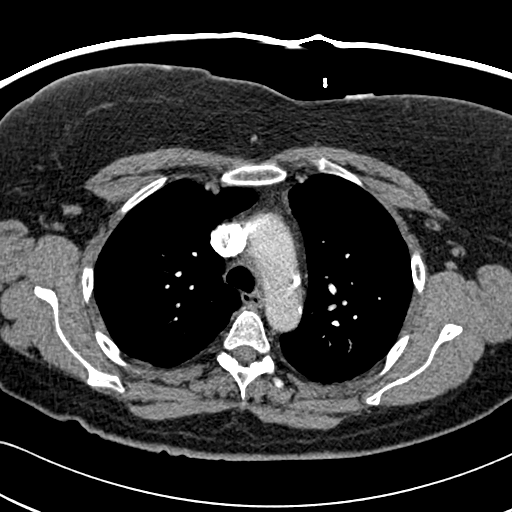
[im 209/261  lung]
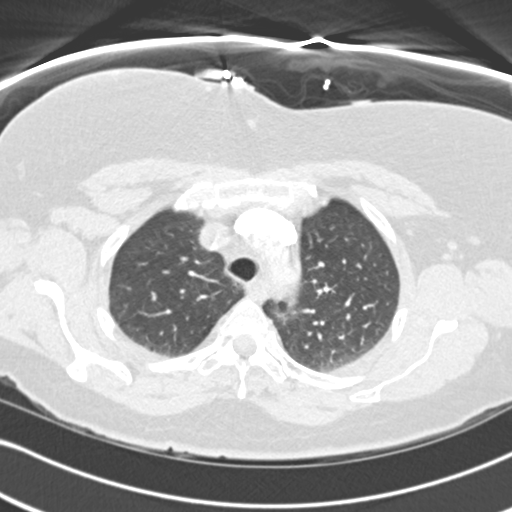
[im 222/261  mediastinal]
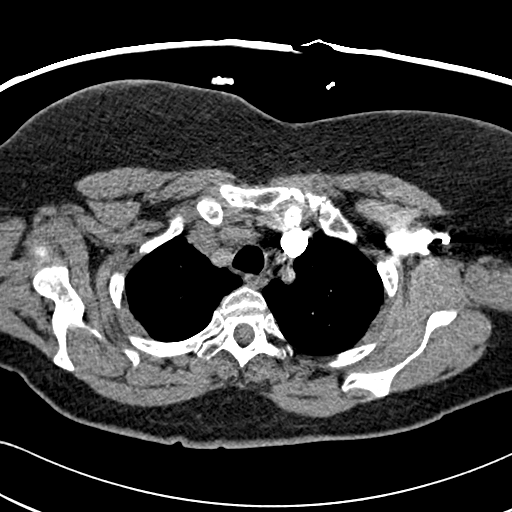
[im 235/261  lung]
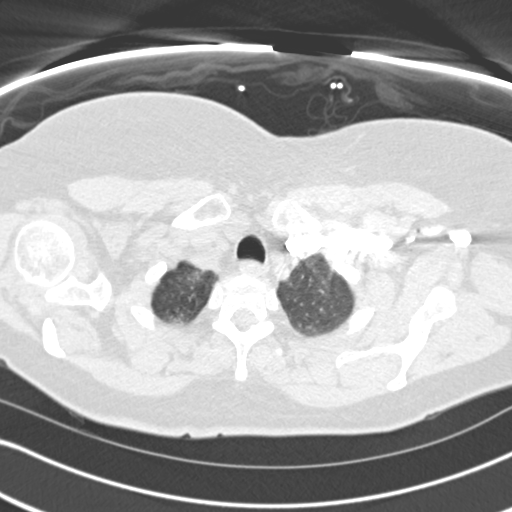
[im 248/261  mediastinal]
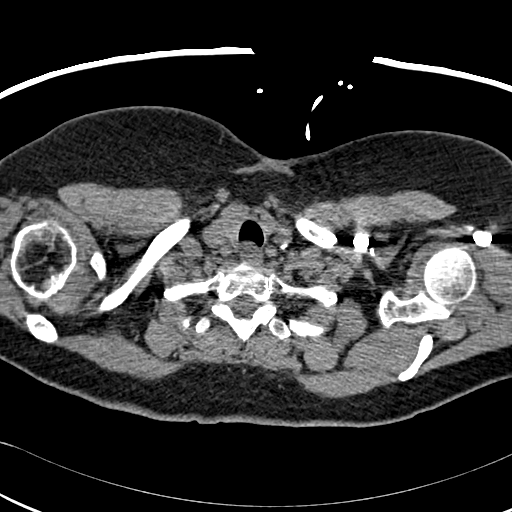

[Series 603: <mpr thick range> · coronal · 0.63mm/px · 1 of 91 slices shown]
[im 46/91  mediastinal]
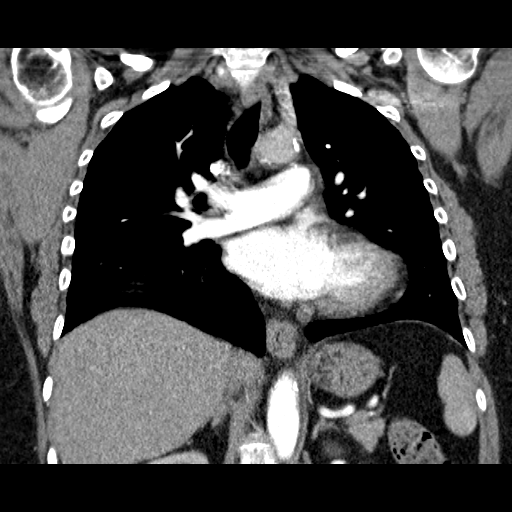

[19 of 36 positions shown; findings below may reference images not displayed]

FINDINGS: There are no filling defects within the opacified
pulmonary arteries to suggest the presence of an acute pulmonary
embolus.  Image detail of the segmental and subsegmental pulmonary
arteries to the lower lobes is degraded by respiratory motion.  No
thoracic aortic aneurysm.  No evidence for thoracic aortic
dissection.

Heart is borderline enlarged.  No pericardial or pleural effusion.

Lung windows are without parenchymal nodule or mass.  There is no
focal airspace consolidation.  Compressive atelectasis is noted
dependently in both lower lungs.

Bone windows reveal no worrisome lytic or sclerotic osseous
lesions.

Review of the MIP images confirms the above findings.
IMPRESSION: No CT evidence for acute pulmonary embolus.

No findings to explain the patient's history of chest pain.

## 2011-08-17 ENCOUNTER — Other Ambulatory Visit: Payer: Self-pay | Admitting: Gynecology

## 2012-03-09 DIAGNOSIS — H2510 Age-related nuclear cataract, unspecified eye: Secondary | ICD-10-CM | POA: Diagnosis not present

## 2012-06-28 DIAGNOSIS — H6120 Impacted cerumen, unspecified ear: Secondary | ICD-10-CM | POA: Diagnosis not present

## 2012-08-22 ENCOUNTER — Other Ambulatory Visit: Payer: Self-pay | Admitting: Gynecology

## 2012-08-22 DIAGNOSIS — I10 Essential (primary) hypertension: Secondary | ICD-10-CM | POA: Diagnosis not present

## 2012-08-22 DIAGNOSIS — Z01419 Encounter for gynecological examination (general) (routine) without abnormal findings: Secondary | ICD-10-CM | POA: Diagnosis not present

## 2012-08-29 DIAGNOSIS — I10 Essential (primary) hypertension: Secondary | ICD-10-CM | POA: Diagnosis not present

## 2012-08-29 DIAGNOSIS — E785 Hyperlipidemia, unspecified: Secondary | ICD-10-CM | POA: Diagnosis not present

## 2012-08-29 DIAGNOSIS — E039 Hypothyroidism, unspecified: Secondary | ICD-10-CM | POA: Diagnosis not present

## 2012-09-12 DIAGNOSIS — Z1331 Encounter for screening for depression: Secondary | ICD-10-CM | POA: Diagnosis not present

## 2012-09-12 DIAGNOSIS — Z0000 Encounter for general adult medical examination without abnormal findings: Secondary | ICD-10-CM | POA: Diagnosis not present

## 2012-09-12 DIAGNOSIS — M2550 Pain in unspecified joint: Secondary | ICD-10-CM | POA: Diagnosis not present

## 2012-09-12 DIAGNOSIS — L259 Unspecified contact dermatitis, unspecified cause: Secondary | ICD-10-CM | POA: Diagnosis not present

## 2012-12-27 DIAGNOSIS — H6120 Impacted cerumen, unspecified ear: Secondary | ICD-10-CM | POA: Diagnosis not present

## 2013-01-24 ENCOUNTER — Other Ambulatory Visit: Payer: Self-pay | Admitting: *Deleted

## 2013-01-24 MED ORDER — ROSUVASTATIN CALCIUM 10 MG PO TABS: 10.0000 mg | ORAL_TABLET | Freq: Every day | ORAL | Status: DC

## 2013-03-15 DIAGNOSIS — Z6841 Body Mass Index (BMI) 40.0 and over, adult: Secondary | ICD-10-CM | POA: Diagnosis not present

## 2013-03-15 DIAGNOSIS — E039 Hypothyroidism, unspecified: Secondary | ICD-10-CM | POA: Diagnosis not present

## 2013-03-15 DIAGNOSIS — K219 Gastro-esophageal reflux disease without esophagitis: Secondary | ICD-10-CM | POA: Diagnosis not present

## 2013-03-15 DIAGNOSIS — E785 Hyperlipidemia, unspecified: Secondary | ICD-10-CM | POA: Diagnosis not present

## 2013-03-15 DIAGNOSIS — I10 Essential (primary) hypertension: Secondary | ICD-10-CM | POA: Diagnosis not present

## 2013-03-15 DIAGNOSIS — F17200 Nicotine dependence, unspecified, uncomplicated: Secondary | ICD-10-CM | POA: Diagnosis not present

## 2013-03-15 DIAGNOSIS — J309 Allergic rhinitis, unspecified: Secondary | ICD-10-CM | POA: Diagnosis not present

## 2013-03-17 ENCOUNTER — Encounter: Payer: Self-pay | Admitting: *Deleted

## 2013-03-20 ENCOUNTER — Encounter: Payer: Self-pay | Admitting: Cardiovascular Disease

## 2013-03-20 ENCOUNTER — Ambulatory Visit (INDEPENDENT_AMBULATORY_CARE_PROVIDER_SITE_OTHER): Payer: Medicare Other | Admitting: Cardiovascular Disease

## 2013-03-20 VITALS — BP 144/80 | HR 67 | Resp 20 | Ht 59.0 in | Wt 208.5 lb

## 2013-03-20 DIAGNOSIS — I10 Essential (primary) hypertension: Secondary | ICD-10-CM | POA: Diagnosis not present

## 2013-03-20 DIAGNOSIS — E785 Hyperlipidemia, unspecified: Secondary | ICD-10-CM

## 2013-03-20 DIAGNOSIS — F17200 Nicotine dependence, unspecified, uncomplicated: Secondary | ICD-10-CM | POA: Diagnosis not present

## 2013-03-20 DIAGNOSIS — Z720 Tobacco use: Secondary | ICD-10-CM

## 2013-03-20 NOTE — Patient Instructions (Addendum)
Your physician recommends that you schedule a follow-up appointment in: one year  

## 2013-03-25 ENCOUNTER — Other Ambulatory Visit: Payer: Self-pay | Admitting: Cardiovascular Disease

## 2013-03-25 DIAGNOSIS — E785 Hyperlipidemia, unspecified: Secondary | ICD-10-CM | POA: Insufficient documentation

## 2013-03-25 DIAGNOSIS — I10 Essential (primary) hypertension: Secondary | ICD-10-CM | POA: Insufficient documentation

## 2013-03-25 DIAGNOSIS — Z720 Tobacco use: Secondary | ICD-10-CM | POA: Insufficient documentation

## 2013-03-25 NOTE — Assessment & Plan Note (Signed)
No progress in weight loss since last year's appointment.

## 2013-03-25 NOTE — Assessment & Plan Note (Signed)
Borderline arrival. We checked her blood pressure was better at 139/77 mm Hg. No adjustments in medicines today.

## 2013-03-25 NOTE — Assessment & Plan Note (Signed)
Discussed need for smoking cessation. She expresses interest but does not appear ready to commit. Reviewed  lifestyle changes that can assist with this.

## 2013-03-25 NOTE — Assessment & Plan Note (Addendum)
Typical pattern consistent with the metabolic syndrome with elevated triglycerides and low HDL cholesterol. She does not have diabetes mellitus but is likely to be hyperinsulinemic. Weight loss and increased physical exercise are the mainstay of therapy. The dose of statin has not really required adjustment this time

## 2013-03-25 NOTE — Progress Notes (Signed)
Patient ID: Monica Cordova, female   DOB: 05/22/1946, 67 y.o.   MRN: [id number]     Reason for office visit Multiple coronary risk factors  This is my first encounter with [name] who was previously followed by Dr. Caprice Kluver until his recent retirement. She has multiple coronary risk factors of which are not yet successfully addressed. She continues to smoke cigarettes although she considers is a light smoker. Her blood pressure is borderline high. She is morbidly obese. In addition she has a strong family history of premature CAD. Her father had a myocardial infarction at age 38. She has numerous musculoskeletal complaints but no cardiovascular problems at this time. She rarely has ankle edema and specifically denies chest pain or shortness of breath. She does complain of heavy calves and has obvious varicose veins.    Allergies  Allergen Reactions  . Antihistamines, Diphenhydramine-Type     Tachycardia   . Codeine     Current Outpatient Prescriptions  Medication Sig Dispense Refill  . Coenzyme Q10 (CO Q 10) 100 MG CAPS Take 1 capsule by mouth daily.      Marland Kitchen DIGESTIVE ENZYMES PO Take 1 tablet by mouth daily.      Marland Kitchen estradiol (ESTRACE) 0.1 MG/GM vaginal cream Place 2 g vaginally 2 (two) times a week.      . estradiol (VIVELLE-DOT) 0.0375 MG/24HR Place 1 patch onto the skin 2 (two) times a week.      . levothyroxine (SYNTHROID, LEVOTHROID) 50 MCG tablet Take 50 mcg by mouth daily before breakfast.      . losartan (COZAAR) 50 MG tablet Take 50 mg by mouth 2 (two) times daily.      Marland Kitchen omeprazole (PRILOSEC OTC) 20 MG tablet Take 20 mg by mouth daily.      . Probiotic Product (PROBIOTIC DAILY PO) Take 1 capsule by mouth daily.      . rosuvastatin (CRESTOR) 10 MG tablet Take 1 tablet (10 mg total) by mouth daily.  30 tablet  1  . vitamin C (ASCORBIC ACID) 500 MG tablet Take 500 mg by mouth daily.       No current facility-administered medications for this visit.    Past Medical  History  Diagnosis Date  . Hyperlipemia   . Hypertension     Past Surgical History  Procedure Laterality Date  . Ganglion cyst excision  1970    right arm  . Ovary surgery  1995  . Bladder surgery  1995  . Abdominal hysterectomy  2002  . Cardiac catheterization  08/11/2000    no significant CAD  . Nm myocar perf wall motion  10/18/2006    no significant ischemia    Family History  Problem Relation Age of Onset  . Hyperlipidemia Mother   . Hyperlipidemia Brother     History   Social History  . Marital Status: Divorced    Spouse Name: N/A    Number of Children: N/A  . Years of Education: N/A   Occupational History  . Not on file.   Social History Main Topics  . Smoking status: Light Tobacco Smoker -- 46 years    Types: Cigarettes  . Smokeless tobacco: Never Used  . Alcohol Use: .6 - 1.2 oz/week    1-2 Glasses of wine per week  . Drug Use: No  . Sexual Activity: Not on file   Other Topics Concern  . Not on file   Social History Narrative  . No narrative on file  Review of systems: The patient specifically denies any chest pain at rest or with exertion, dyspnea at rest or with exertion, orthopnea, paroxysmal nocturnal dyspnea, syncope, palpitations, focal neurological deficits, intermittent claudication, lower extremity edema, unexplained weight gain, cough, hemoptysis or wheezing.  The patient also denies abdominal pain, nausea, vomiting, dysphagia, diarrhea, constipation, polyuria, polydipsia, dysuria, hematuria, frequency, urgency, abnormal bleeding or bruising, fever, chills, unexpected weight changes, mood swings, change in skin or hair texture, change in voice quality, auditory or visual problems, allergic reactions or rashes, new musculoskeletal complaints other than usual "aches and pains".    PHYSICAL EXAM BP 144/80  Pulse 67  Resp 20  Ht 4\' 11"  (1.499 m)  Wt 208 lb 8 oz (94.575 kg)  BMI 42.09 kg/m2  General: Alert, oriented x3, no distress,  morbidly obese Head: no evidence of trauma, PERRL, EOMI, no exophtalmos or lid lag, no myxedema, no xanthelasma; normal ears, nose and oropharynx Neck: normal jugular venous pulsations and no hepatojugular reflux; brisk carotid pulses without delay and no carotid bruits Chest: clear to auscultation, no signs of consolidation by percussion or palpation, normal fremitus, symmetrical and full respiratory excursions Cardiovascular: normal position and quality of the apical impulse, regular rhythm, normal first and second heart sounds, no murmurs, rubs or gallops Abdomen: Limited exam , no obvious tenderness or distention, no masses by palpation, no abnormal pulsatility or arterial bruits, normal bowel sounds, no hepatosplenomegaly Extremities: no clubbing, cyanosis; minimal bilateral ankle edema; 2+ radial, ulnar and brachial pulses bilaterally; 2+ right femoral, posterior tibial and dorsalis pedis pulses; 2+ left femoral, posterior tibial and dorsalis pedis pulses; no subclavian or femoral bruits Neurological: grossly nonfocal  EKG: Normal sinus rhythm, low voltage secondary to obesity  Lipid Panel 03/20/2013 total cholesterol 193, triglycerides 234, HDL 41, LDL 105, apoB high at 107  Normal TSH, normal liver function tests, creatinine 1.0, fasting glucose 95   BMET    Component Value Date/Time   NA 139 12/07/2009 0158   K 3.9 12/07/2009 0158   CL 111 12/07/2009 0158   CO2 22 12/07/2009 0158   GLUCOSE 128* 12/07/2009 0158   BUN 14 12/07/2009 0158   CREATININE 0.99 12/07/2009 0158   CALCIUM 8.6 12/07/2009 0158   GFRNONAA 57* 12/07/2009 0158   GFRAA  Value: >60        The eGFR has been calculated using the MDRD equation. This calculation has not been validated in all clinical situations. eGFR's persistently <60 mL/min signify possible Chronic Kidney Disease. 12/07/2009 0158     ASSESSMENT AND PLAN HTN (hypertension) Borderline arrival. We checked her blood pressure was better at 139/77 mm Hg. No  adjustments in medicines today.  Hyperlipidemia Typical pattern consistent with the metabolic syndrome with elevated triglycerides and low HDL cholesterol. She does not have diabetes mellitus but is likely to be hyperinsulinemic. Weight loss and increased physical exercise are the mainstay of therapy. The dose of statin has not really required adjustment this time  Morbid obesity No progress in weight loss since last year's appointment.  Tobacco abuse Discussed need for smoking cessation. She expresses interest but does not appear ready to commit. Reviewed  lifestyle changes that can assist with this.   Orders Placed This Encounter  Procedures  . EKG 12-Lead   Meds ordered this encounter  Medications  . levothyroxine (SYNTHROID, LEVOTHROID) 50 MCG tablet    Sig: Take 50 mcg by mouth daily before breakfast.  . losartan (COZAAR) 50 MG tablet    Sig: Take 50 mg  by mouth 2 (two) times daily.  Marland Kitchen estradiol (VIVELLE-DOT) 0.0375 MG/24HR    Sig: Place 1 patch onto the skin 2 (two) times a week.  . estradiol (ESTRACE) 0.1 MG/GM vaginal cream    Sig: Place 2 g vaginally 2 (two) times a week.  . Probiotic Product (PROBIOTIC DAILY PO)    Sig: Take 1 capsule by mouth daily.  Marland Kitchen DIGESTIVE ENZYMES PO    Sig: Take 1 tablet by mouth daily.  . vitamin C (ASCORBIC ACID) 500 MG tablet    Sig: Take 500 mg by mouth daily.  . Coenzyme Q10 (CO Q 10) 100 MG CAPS    Sig: Take 1 capsule by mouth daily.  Marland Kitchen omeprazole (PRILOSEC OTC) 20 MG tablet    Sig: Take 20 mg by mouth daily.    Junious Silk, MD, [facility] [phone] office [phone] pager

## 2013-03-27 NOTE — Telephone Encounter (Signed)
Rx was sent to pharmacy electronically. 

## 2013-04-04 ENCOUNTER — Encounter: Payer: Self-pay | Admitting: Cardiovascular Disease

## 2013-06-06 DIAGNOSIS — M545 Low back pain: Secondary | ICD-10-CM | POA: Diagnosis not present

## 2013-06-26 DIAGNOSIS — M545 Low back pain: Secondary | ICD-10-CM | POA: Diagnosis not present

## 2013-07-11 DIAGNOSIS — M545 Low back pain: Secondary | ICD-10-CM | POA: Diagnosis not present

## 2013-07-12 DIAGNOSIS — H60399 Other infective otitis externa, unspecified ear: Secondary | ICD-10-CM | POA: Diagnosis not present

## 2013-07-12 DIAGNOSIS — L259 Unspecified contact dermatitis, unspecified cause: Secondary | ICD-10-CM | POA: Diagnosis not present

## 2013-07-12 DIAGNOSIS — H6120 Impacted cerumen, unspecified ear: Secondary | ICD-10-CM | POA: Diagnosis not present

## 2013-07-12 DIAGNOSIS — Z6841 Body Mass Index (BMI) 40.0 and over, adult: Secondary | ICD-10-CM | POA: Diagnosis not present

## 2013-07-14 DIAGNOSIS — M545 Low back pain: Secondary | ICD-10-CM | POA: Diagnosis not present

## 2013-07-17 DIAGNOSIS — M545 Low back pain: Secondary | ICD-10-CM | POA: Diagnosis not present

## 2013-07-24 DIAGNOSIS — M76899 Other specified enthesopathies of unspecified lower limb, excluding foot: Secondary | ICD-10-CM | POA: Diagnosis not present

## 2013-07-25 DIAGNOSIS — M545 Low back pain: Secondary | ICD-10-CM | POA: Diagnosis not present

## 2013-07-27 DIAGNOSIS — M545 Low back pain: Secondary | ICD-10-CM | POA: Diagnosis not present

## 2013-07-31 DIAGNOSIS — M545 Low back pain: Secondary | ICD-10-CM | POA: Diagnosis not present

## 2013-08-08 DIAGNOSIS — M545 Low back pain: Secondary | ICD-10-CM | POA: Diagnosis not present

## 2013-09-14 DIAGNOSIS — M76899 Other specified enthesopathies of unspecified lower limb, excluding foot: Secondary | ICD-10-CM | POA: Diagnosis not present

## 2013-09-14 DIAGNOSIS — R209 Unspecified disturbances of skin sensation: Secondary | ICD-10-CM | POA: Diagnosis not present

## 2013-09-14 DIAGNOSIS — M545 Low back pain, unspecified: Secondary | ICD-10-CM | POA: Diagnosis not present

## 2013-09-25 DIAGNOSIS — R82998 Other abnormal findings in urine: Secondary | ICD-10-CM | POA: Diagnosis not present

## 2013-09-25 DIAGNOSIS — Z0000 Encounter for general adult medical examination without abnormal findings: Secondary | ICD-10-CM | POA: Diagnosis not present

## 2013-09-25 DIAGNOSIS — Z1231 Encounter for screening mammogram for malignant neoplasm of breast: Secondary | ICD-10-CM | POA: Diagnosis not present

## 2013-09-25 DIAGNOSIS — N959 Unspecified menopausal and perimenopausal disorder: Secondary | ICD-10-CM | POA: Diagnosis not present

## 2013-09-25 DIAGNOSIS — Z01419 Encounter for gynecological examination (general) (routine) without abnormal findings: Secondary | ICD-10-CM | POA: Diagnosis not present

## 2013-09-28 DIAGNOSIS — E039 Hypothyroidism, unspecified: Secondary | ICD-10-CM | POA: Diagnosis not present

## 2013-09-28 DIAGNOSIS — M949 Disorder of cartilage, unspecified: Secondary | ICD-10-CM | POA: Diagnosis not present

## 2013-09-28 DIAGNOSIS — E785 Hyperlipidemia, unspecified: Secondary | ICD-10-CM | POA: Diagnosis not present

## 2013-09-28 DIAGNOSIS — M899 Disorder of bone, unspecified: Secondary | ICD-10-CM | POA: Diagnosis not present

## 2013-10-02 DIAGNOSIS — I10 Essential (primary) hypertension: Secondary | ICD-10-CM | POA: Diagnosis not present

## 2013-10-02 DIAGNOSIS — K219 Gastro-esophageal reflux disease without esophagitis: Secondary | ICD-10-CM | POA: Diagnosis not present

## 2013-10-02 DIAGNOSIS — J309 Allergic rhinitis, unspecified: Secondary | ICD-10-CM | POA: Diagnosis not present

## 2013-10-02 DIAGNOSIS — E785 Hyperlipidemia, unspecified: Secondary | ICD-10-CM | POA: Diagnosis not present

## 2013-10-02 DIAGNOSIS — L259 Unspecified contact dermatitis, unspecified cause: Secondary | ICD-10-CM | POA: Diagnosis not present

## 2013-10-02 DIAGNOSIS — Z1331 Encounter for screening for depression: Secondary | ICD-10-CM | POA: Diagnosis not present

## 2013-10-02 DIAGNOSIS — Z0000 Encounter for general adult medical examination without abnormal findings: Secondary | ICD-10-CM | POA: Diagnosis not present

## 2013-10-02 DIAGNOSIS — E039 Hypothyroidism, unspecified: Secondary | ICD-10-CM | POA: Diagnosis not present

## 2013-10-02 DIAGNOSIS — F17200 Nicotine dependence, unspecified, uncomplicated: Secondary | ICD-10-CM | POA: Diagnosis not present

## 2014-02-19 DIAGNOSIS — Z7189 Other specified counseling: Secondary | ICD-10-CM | POA: Diagnosis not present

## 2014-02-19 DIAGNOSIS — I10 Essential (primary) hypertension: Secondary | ICD-10-CM | POA: Diagnosis not present

## 2014-02-19 DIAGNOSIS — T6391XA Toxic effect of contact with unspecified venomous animal, accidental (unintentional), initial encounter: Secondary | ICD-10-CM | POA: Diagnosis not present

## 2014-02-19 DIAGNOSIS — F17200 Nicotine dependence, unspecified, uncomplicated: Secondary | ICD-10-CM | POA: Diagnosis not present

## 2014-02-19 DIAGNOSIS — E785 Hyperlipidemia, unspecified: Secondary | ICD-10-CM | POA: Diagnosis not present

## 2014-03-24 ENCOUNTER — Other Ambulatory Visit: Payer: Self-pay | Admitting: Cardiovascular Disease

## 2014-03-26 NOTE — Telephone Encounter (Signed)
Rx refill sent to patient pharmacy   

## 2014-04-02 DIAGNOSIS — Z6841 Body Mass Index (BMI) 40.0 and over, adult: Secondary | ICD-10-CM | POA: Diagnosis not present

## 2014-04-02 DIAGNOSIS — M2550 Pain in unspecified joint: Secondary | ICD-10-CM | POA: Diagnosis not present

## 2014-04-02 DIAGNOSIS — E785 Hyperlipidemia, unspecified: Secondary | ICD-10-CM | POA: Diagnosis not present

## 2014-04-02 DIAGNOSIS — I10 Essential (primary) hypertension: Secondary | ICD-10-CM | POA: Diagnosis not present

## 2014-04-02 DIAGNOSIS — T6391XA Toxic effect of contact with unspecified venomous animal, accidental (unintentional), initial encounter: Secondary | ICD-10-CM | POA: Diagnosis not present

## 2014-04-02 DIAGNOSIS — E039 Hypothyroidism, unspecified: Secondary | ICD-10-CM | POA: Diagnosis not present

## 2014-04-11 ENCOUNTER — Encounter: Payer: Self-pay | Admitting: Cardiovascular Disease

## 2014-04-11 ENCOUNTER — Ambulatory Visit (INDEPENDENT_AMBULATORY_CARE_PROVIDER_SITE_OTHER): Payer: Medicare Other | Admitting: Cardiovascular Disease

## 2014-04-11 VITALS — BP 134/62 | HR 77 | Resp 16 | Ht 59.0 in | Wt 208.4 lb

## 2014-04-11 DIAGNOSIS — I10 Essential (primary) hypertension: Secondary | ICD-10-CM

## 2014-04-11 DIAGNOSIS — E785 Hyperlipidemia, unspecified: Secondary | ICD-10-CM | POA: Diagnosis not present

## 2014-04-11 DIAGNOSIS — F17200 Nicotine dependence, unspecified, uncomplicated: Secondary | ICD-10-CM

## 2014-04-11 DIAGNOSIS — Z720 Tobacco use: Secondary | ICD-10-CM

## 2014-04-11 NOTE — Progress Notes (Signed)
Patient ID: LACHRISTA HESLIN, female   DOB: 07/23/1946, 68 y.o.   MRN: [id number]     Reason for office visit HTN, hyperlipidemia, peripheral venous insufficiency  Mrs. Cockerham has little in the way of cardiac complaints. Her ankles were more swollen after a stay at the beach. She has had hip and back pain. She is frustrated with her weight. She smokes cigarettes "only at a party with a glass of wine". A pack lasts her 2 weeks or longer. Had labs at Dr. Danna Hefty office earlier this year.  She has known mild coronary atherosclerosis by cath in 2002 (mild plaque, max stenosis < 50% in all three coronary territories), normal LV function. Varicose veins without significant reflux or DVT.   Allergies  Allergen Reactions  . Antihistamines, Diphenhydramine-Type     Tachycardia   . Codeine     Current Outpatient Prescriptions  Medication Sig Dispense Refill  . Coenzyme Q10 (CO Q 10) 100 MG CAPS Take 1 capsule by mouth daily.      . CRESTOR 10 MG tablet TAKE 1 TABLET BY MOUTH EVERY DAY  30 tablet  1  . DIGESTIVE ENZYMES PO Take 2 tablets by mouth 2 (two) times daily before a meal.       . EPIPEN 2-PAK 0.3 MG/0.3ML SOAJ injection As Needed      . estradiol (ESTRACE) 0.1 MG/GM vaginal cream Place 2 g vaginally 2 (two) times a week.      . estradiol (VIVELLE-DOT) 0.0375 MG/24HR Place 1 patch onto the skin 2 (two) times a week.      . levothyroxine (SYNTHROID, LEVOTHROID) 50 MCG tablet Take 50 mcg by mouth 2 (two) times daily.       Marland Kitchen losartan (COZAAR) 50 MG tablet Take 50 mg by mouth 2 (two) times daily.      Marland Kitchen omeprazole (PRILOSEC OTC) 20 MG tablet Take 20 mg by mouth daily.      . Probiotic Product (PROBIOTIC DAILY PO) Take 1 capsule by mouth daily.      . vitamin C (ASCORBIC ACID) 500 MG tablet Take 500 mg by mouth daily.       No current facility-administered medications for this visit.    Past Medical History  Diagnosis Date  . Hyperlipemia   . Hypertension     Past Surgical  History  Procedure Laterality Date  . Ganglion cyst excision  1970    right arm  . Ovary surgery  1995  . Bladder surgery  1995  . Abdominal hysterectomy  2002  . Cardiac catheterization  08/11/2000    no significant CAD  . Nm myocar perf wall motion  10/18/2006    no significant ischemia    Family History  Problem Relation Age of Onset  . Hyperlipidemia Mother   . Hyperlipidemia Brother     History   Social History  . Marital Status: Divorced    Spouse Name: N/A    Number of Children: N/A  . Years of Education: N/A   Occupational History  . Not on file.   Social History Main Topics  . Smoking status: Light Tobacco Smoker -- 46 years    Types: Cigarettes  . Smokeless tobacco: Never Used  . Alcohol Use: 0.6 - 1.2 oz/week    1-2 Glasses of wine per week  . Drug Use: No  . Sexual Activity: Not on file   Other Topics Concern  . Not on file   Social History Narrative  . No narrative on  file    Review of systems: The patient specifically denies any chest pain at rest or with exertion, dyspnea at rest or with exertion, orthopnea, paroxysmal nocturnal dyspnea, syncope, palpitations, focal neurological deficits, intermittent claudication, lower extremity edema, unexplained weight gain, cough, hemoptysis or wheezing.  The patient also denies abdominal pain, nausea, vomiting, dysphagia, diarrhea, constipation, polyuria, polydipsia, dysuria, hematuria, frequency, urgency, abnormal bleeding or bruising, fever, chills, unexpected weight changes, mood swings, change in skin or hair texture, change in voice quality, auditory or visual problems, allergic reactions or rashes, new musculoskeletal complaints other than usual "aches and pains".   PHYSICAL EXAM BP 134/62  Pulse 77  Resp 16  Ht $R'4\' 11"'tY$  (1.499 m)  Wt 208 lb 6.4 oz (94.53 kg)  BMI 42.07 kg/m2 General: Alert, oriented x3, no distress, morbidly obese  Head: no evidence of trauma, PERRL, EOMI, no exophtalmos or lid lag, no  myxedema, no xanthelasma; normal ears, nose and oropharynx  Neck: normal jugular venous pulsations and no hepatojugular reflux; brisk carotid pulses without delay and no carotid bruits  Chest: clear to auscultation, no signs of consolidation by percussion or palpation, normal fremitus, symmetrical and full respiratory excursions  Cardiovascular: normal position and quality of the apical impulse, regular rhythm, normal first and second heart sounds, no murmurs, rubs or gallops  Abdomen: Limited exam , no obvious tenderness or distention, no masses by palpation, no abnormal pulsatility or arterial bruits, normal bowel sounds, no hepatosplenomegaly  Extremities: no clubbing, cyanosis; minimal bilateral ankle edema; 2+ radial, ulnar and brachial pulses bilaterally; 2+ right femoral, posterior tibial and dorsalis pedis pulses; 2+ left femoral, posterior tibial and dorsalis pedis pulses; no subclavian or femoral bruits  Neurological: grossly nonfocal   EKG: Normal sinus rhythm, low voltage secondary to obesity   Lipid Panel  03/20/2013 total cholesterol 193, triglycerides 234, HDL 41, LDL 105, apoB high at 107 January 2015 labs not yet available  BMET    Component Value Date/Time   NA 139 12/07/2009 0158   K 3.9 12/07/2009 0158   CL 111 12/07/2009 0158   CO2 22 12/07/2009 0158   GLUCOSE 128* 12/07/2009 0158   BUN 14 12/07/2009 0158   CREATININE 0.99 12/07/2009 0158   CALCIUM 8.6 12/07/2009 0158   GFRNONAA 57* 12/07/2009 0158   GFRAA  Value: >60        The eGFR has been calculated using the MDRD equation. This calculation has not been validated in all clinical situations. eGFR's persistently <60 mL/min signify possible Chronic Kidney Disease. 12/07/2009 0158     ASSESSMENT AND PLAN HTN (hypertension)  Good control  Hyperlipidemia  Typical pattern consistent with the metabolic syndrome with elevated triglycerides and low HDL cholesterol. She does not have diabetes mellitus but is likely to be  hyperinsulinemic. Weight loss and increased physical exercise are the mainstay of therapy. Will get most recent results. She asked about red yeast rice - told her it is essentially a weak statin, unlikely to be sufficient in her case.  Morbid obesity  No progress in weight loss since last year's appointment.   Tobacco abuse  Discussed need for smoking cessation. She is more defensive about this today, does not appear to want to quit   No orders of the defined types were placed in this encounter.   Meds ordered this encounter  Medications  . EPIPEN 2-PAK 0.3 MG/0.3ML SOAJ injection    Sig: As Needed    Holli Humbles, MD, [facility] [phone] office [phone]  pager

## 2014-04-11 NOTE — Patient Instructions (Signed)
Please keep a log of your blood pressure readings, in particular in the afternoons after your second dose of Lisinopril.  Send your readings to our office :  One year [address] [place],  [place] [address]  Dr. Royann Shivers wants you to follow-up in: One year.  You will receive a reminder letter in the mail two months in advance. If you don't receive a letter, please call our office to schedule the follow-up appointment.

## 2014-05-20 ENCOUNTER — Other Ambulatory Visit: Payer: Self-pay | Admitting: Cardiovascular Disease

## 2014-05-21 NOTE — Telephone Encounter (Signed)
Rx was sent to pharmacy electronically. 

## 2014-06-21 DIAGNOSIS — L309 Dermatitis, unspecified: Secondary | ICD-10-CM | POA: Diagnosis not present

## 2014-06-21 DIAGNOSIS — I10 Essential (primary) hypertension: Secondary | ICD-10-CM | POA: Diagnosis not present

## 2014-06-21 DIAGNOSIS — H6090 Unspecified otitis externa, unspecified ear: Secondary | ICD-10-CM | POA: Diagnosis not present

## 2014-06-21 DIAGNOSIS — H6120 Impacted cerumen, unspecified ear: Secondary | ICD-10-CM | POA: Diagnosis not present

## 2014-06-21 DIAGNOSIS — F17200 Nicotine dependence, unspecified, uncomplicated: Secondary | ICD-10-CM | POA: Diagnosis not present

## 2014-06-21 DIAGNOSIS — J309 Allergic rhinitis, unspecified: Secondary | ICD-10-CM | POA: Diagnosis not present

## 2014-06-21 DIAGNOSIS — Z6841 Body Mass Index (BMI) 40.0 and over, adult: Secondary | ICD-10-CM | POA: Diagnosis not present

## 2014-09-17 DIAGNOSIS — L820 Inflamed seborrheic keratosis: Secondary | ICD-10-CM | POA: Diagnosis not present

## 2014-09-17 DIAGNOSIS — L400 Psoriasis vulgaris: Secondary | ICD-10-CM | POA: Diagnosis not present

## 2014-09-17 DIAGNOSIS — L919 Hypertrophic disorder of the skin, unspecified: Secondary | ICD-10-CM | POA: Diagnosis not present

## 2014-09-17 DIAGNOSIS — L821 Other seborrheic keratosis: Secondary | ICD-10-CM | POA: Diagnosis not present

## 2014-10-01 DIAGNOSIS — N951 Menopausal and female climacteric states: Secondary | ICD-10-CM | POA: Diagnosis not present

## 2014-10-01 DIAGNOSIS — Z01419 Encounter for gynecological examination (general) (routine) without abnormal findings: Secondary | ICD-10-CM | POA: Diagnosis not present

## 2014-10-01 DIAGNOSIS — Z1231 Encounter for screening mammogram for malignant neoplasm of breast: Secondary | ICD-10-CM | POA: Diagnosis not present

## 2014-10-01 DIAGNOSIS — Z1211 Encounter for screening for malignant neoplasm of colon: Secondary | ICD-10-CM | POA: Diagnosis not present

## 2014-10-23 DIAGNOSIS — K2270 Barrett's esophagus without dysplasia: Secondary | ICD-10-CM | POA: Diagnosis not present

## 2014-10-23 DIAGNOSIS — Z86010 Personal history of colonic polyps: Secondary | ICD-10-CM | POA: Diagnosis not present

## 2014-10-23 DIAGNOSIS — Z1211 Encounter for screening for malignant neoplasm of colon: Secondary | ICD-10-CM | POA: Diagnosis not present

## 2014-10-23 DIAGNOSIS — K625 Hemorrhage of anus and rectum: Secondary | ICD-10-CM | POA: Diagnosis not present

## 2014-10-23 DIAGNOSIS — K219 Gastro-esophageal reflux disease without esophagitis: Secondary | ICD-10-CM | POA: Diagnosis not present

## 2014-10-31 DIAGNOSIS — Z008 Encounter for other general examination: Secondary | ICD-10-CM | POA: Diagnosis not present

## 2014-10-31 DIAGNOSIS — M859 Disorder of bone density and structure, unspecified: Secondary | ICD-10-CM | POA: Diagnosis not present

## 2014-10-31 DIAGNOSIS — E039 Hypothyroidism, unspecified: Secondary | ICD-10-CM | POA: Diagnosis not present

## 2014-10-31 DIAGNOSIS — E785 Hyperlipidemia, unspecified: Secondary | ICD-10-CM | POA: Diagnosis not present

## 2014-10-31 DIAGNOSIS — I10 Essential (primary) hypertension: Secondary | ICD-10-CM | POA: Diagnosis not present

## 2014-11-07 DIAGNOSIS — R312 Other microscopic hematuria: Secondary | ICD-10-CM | POA: Diagnosis not present

## 2014-11-07 DIAGNOSIS — M8580 Other specified disorders of bone density and structure, unspecified site: Secondary | ICD-10-CM | POA: Diagnosis not present

## 2014-11-07 DIAGNOSIS — Z6841 Body Mass Index (BMI) 40.0 and over, adult: Secondary | ICD-10-CM | POA: Diagnosis not present

## 2014-11-07 DIAGNOSIS — N390 Urinary tract infection, site not specified: Secondary | ICD-10-CM | POA: Diagnosis not present

## 2014-11-07 DIAGNOSIS — J309 Allergic rhinitis, unspecified: Secondary | ICD-10-CM | POA: Diagnosis not present

## 2014-11-07 DIAGNOSIS — I10 Essential (primary) hypertension: Secondary | ICD-10-CM | POA: Diagnosis not present

## 2014-11-07 DIAGNOSIS — Z1389 Encounter for screening for other disorder: Secondary | ICD-10-CM | POA: Diagnosis not present

## 2014-11-07 DIAGNOSIS — E785 Hyperlipidemia, unspecified: Secondary | ICD-10-CM | POA: Diagnosis not present

## 2014-11-07 DIAGNOSIS — E039 Hypothyroidism, unspecified: Secondary | ICD-10-CM | POA: Diagnosis not present

## 2014-11-07 DIAGNOSIS — M2550 Pain in unspecified joint: Secondary | ICD-10-CM | POA: Diagnosis not present

## 2014-11-07 DIAGNOSIS — Z0000 Encounter for general adult medical examination without abnormal findings: Secondary | ICD-10-CM | POA: Diagnosis not present

## 2014-11-07 DIAGNOSIS — K219 Gastro-esophageal reflux disease without esophagitis: Secondary | ICD-10-CM | POA: Diagnosis not present

## 2014-11-07 DIAGNOSIS — L309 Dermatitis, unspecified: Secondary | ICD-10-CM | POA: Diagnosis not present

## 2015-01-02 DIAGNOSIS — M7542 Impingement syndrome of left shoulder: Secondary | ICD-10-CM | POA: Diagnosis not present

## 2015-01-02 DIAGNOSIS — M25512 Pain in left shoulder: Secondary | ICD-10-CM | POA: Diagnosis not present

## 2015-01-10 DIAGNOSIS — M7502 Adhesive capsulitis of left shoulder: Secondary | ICD-10-CM | POA: Diagnosis not present

## 2015-01-21 DIAGNOSIS — M7502 Adhesive capsulitis of left shoulder: Secondary | ICD-10-CM | POA: Diagnosis not present

## 2015-01-23 DIAGNOSIS — M7502 Adhesive capsulitis of left shoulder: Secondary | ICD-10-CM | POA: Diagnosis not present

## 2015-01-28 DIAGNOSIS — M7502 Adhesive capsulitis of left shoulder: Secondary | ICD-10-CM | POA: Diagnosis not present

## 2015-01-30 DIAGNOSIS — M25512 Pain in left shoulder: Secondary | ICD-10-CM | POA: Diagnosis not present

## 2015-01-30 DIAGNOSIS — M7502 Adhesive capsulitis of left shoulder: Secondary | ICD-10-CM | POA: Diagnosis not present

## 2015-01-30 DIAGNOSIS — M7542 Impingement syndrome of left shoulder: Secondary | ICD-10-CM | POA: Diagnosis not present

## 2015-04-21 ENCOUNTER — Other Ambulatory Visit: Payer: Self-pay | Admitting: Cardiovascular Disease

## 2015-04-22 NOTE — Telephone Encounter (Signed)
Rx request sent to pharmacy.  

## 2015-05-13 DIAGNOSIS — E669 Obesity, unspecified: Secondary | ICD-10-CM | POA: Diagnosis not present

## 2015-05-13 DIAGNOSIS — Z6841 Body Mass Index (BMI) 40.0 and over, adult: Secondary | ICD-10-CM | POA: Diagnosis not present

## 2015-05-13 DIAGNOSIS — E039 Hypothyroidism, unspecified: Secondary | ICD-10-CM | POA: Diagnosis not present

## 2015-05-13 DIAGNOSIS — K219 Gastro-esophageal reflux disease without esophagitis: Secondary | ICD-10-CM | POA: Diagnosis not present

## 2015-05-13 DIAGNOSIS — E785 Hyperlipidemia, unspecified: Secondary | ICD-10-CM | POA: Diagnosis not present

## 2015-05-13 DIAGNOSIS — M2550 Pain in unspecified joint: Secondary | ICD-10-CM | POA: Diagnosis not present

## 2015-05-13 DIAGNOSIS — I10 Essential (primary) hypertension: Secondary | ICD-10-CM | POA: Diagnosis not present

## 2015-05-22 ENCOUNTER — Other Ambulatory Visit: Payer: Self-pay | Admitting: Cardiovascular Disease

## 2015-05-23 NOTE — Telephone Encounter (Signed)
Rx(s) sent to pharmacy electronically. OV 07/01/15

## 2015-06-17 DIAGNOSIS — M7502 Adhesive capsulitis of left shoulder: Secondary | ICD-10-CM | POA: Diagnosis not present

## 2015-06-17 DIAGNOSIS — M25512 Pain in left shoulder: Secondary | ICD-10-CM | POA: Diagnosis not present

## 2015-06-17 DIAGNOSIS — M7542 Impingement syndrome of left shoulder: Secondary | ICD-10-CM | POA: Diagnosis not present

## 2015-06-17 DIAGNOSIS — M25511 Pain in right shoulder: Secondary | ICD-10-CM | POA: Diagnosis not present

## 2015-06-29 DIAGNOSIS — M25511 Pain in right shoulder: Secondary | ICD-10-CM | POA: Diagnosis not present

## 2015-06-29 DIAGNOSIS — M75101 Unspecified rotator cuff tear or rupture of right shoulder, not specified as traumatic: Secondary | ICD-10-CM | POA: Diagnosis not present

## 2015-06-29 DIAGNOSIS — M7502 Adhesive capsulitis of left shoulder: Secondary | ICD-10-CM | POA: Diagnosis not present

## 2015-06-29 DIAGNOSIS — M7542 Impingement syndrome of left shoulder: Secondary | ICD-10-CM | POA: Diagnosis not present

## 2015-06-30 NOTE — Progress Notes (Signed)
Patient ID: Monica Cordova, female   DOB: 08/05/1946, 69 y.o.   MRN: [id number]     Cardiology Office Note   Date:  07/01/2015   ID:  Monica Cordova, DOB 11/24/1945, MRN [id number]  PCP:  Monica Sauer, MD  Cardiologist:   Thurmon Fair, MD   Chief Complaint  Patient presents with  . Annual Exam      History of Present Illness: Monica Cordova is a 69 y.o. female who presents for HTN, hyperlipidemia, peripheral venous insufficiency   [name] has been feeling well. She has infrequent and minimal ankle swelling. She can perform her usual activities without shortness of breath and has not had any anginal chest discomfort. She had labs performed with her primary care physician in April and they were in good range. She received a prescription for generic rosuvastatin instead of brand name Crestor and after about a month noted that she had a variety of muscle aches. After switching back to brand name Crestor and increasing her dose of coenzyme Q 10-200 mg daily, the aches have resolved.   Her blood pressures a little high today and did not come down even after it was rechecked.  She has known mild coronary atherosclerosis by cath in 2002 (mild plaque, max stenosis < 50% in all three coronary territories), normal LV function. Varicose veins without significant reflux or DVT.  Past Medical History  Diagnosis Date  . Hyperlipemia   . Hypertension     Past Surgical History  Procedure Laterality Date  . Ganglion cyst excision  1970    right arm  . Ovary surgery  1995  . Bladder surgery  1995  . Abdominal hysterectomy  2002  . Cardiac catheterization  08/11/2000    no significant CAD  . Nm myocar perf wall motion  10/18/2006    no significant ischemia     Current Outpatient Prescriptions  Medication Sig Dispense Refill  . Coenzyme Q10 (CO Q 10) 100 MG CAPS Take 200 mg by mouth daily.     Marland Kitchen DIGESTIVE ENZYMES PO Take 2 tablets by mouth 2 (two) times daily before a  meal.     . EPIPEN 2-PAK 0.3 MG/0.3ML SOAJ injection As Needed    . estradiol (ESTRACE) 0.1 MG/GM vaginal cream Place 2 g vaginally 2 (two) times a week.    . estradiol (VIVELLE-DOT) 0.0375 MG/24HR Place 1 patch onto the skin 2 (two) times a week.    . levothyroxine (SYNTHROID, LEVOTHROID) 50 MCG tablet Take 50 mcg by mouth 2 (two) times daily.     Marland Kitchen losartan (COZAAR) 50 MG tablet Take 50 mg by mouth 2 (two) times daily.    Marland Kitchen omeprazole (PRILOSEC OTC) 20 MG tablet Take 20 mg by mouth daily.    . Probiotic Product (PROBIOTIC DAILY PO) Take 1 capsule by mouth daily.    . rosuvastatin (CRESTOR) 10 MG tablet TAKE 1 TABLET BY MOUTH EVERY DAY 30 tablet 1  . vitamin C (ASCORBIC ACID) 500 MG tablet Take 500 mg by mouth daily.     No current facility-administered medications for this visit.    Allergies:   Antihistamines, diphenhydramine-type and Codeine    Social History:  The patient  reports that she has been smoking Cigarettes.  She has smoked for the past 46 years. She has never used smokeless tobacco. She reports that she drinks about 0.6 - 1.2 oz of alcohol per week. She reports that she does not use illicit drugs.   Family History:  The patient's family history includes Hyperlipidemia in her brother and mother.    ROS:  Please see the history of present illness.    Otherwise, review of systems positive for  Shoulder arthralgias.   All other systems are reviewed and negative.    PHYSICAL EXAM: VS:  BP 148/70 mmHg  Pulse 65  Ht  (1.499 m)  Wt 209 lb (94.802 kg)  BMI 42.19 kg/m2 , BMI Body mass index is 42.19 kg/(m^2).  General: Alert, oriented x3, no distress Head: no evidence of trauma, PERRL, EOMI, no exophtalmos or lid lag, no myxedema, no xanthelasma; normal ears, nose and oropharynx Neck: normal jugular venous pulsations and no hepatojugular reflux; brisk carotid pulses without delay and no carotid bruits Chest: clear to auscultation, no signs of consolidation by  percussion or palpation, normal fremitus, symmetrical and full respiratory excursions Cardiovascular: normal position and quality of the apical impulse, regular rhythm, normal first and second heart sounds, no  murmurs, rubs or gallops Abdomen: no tenderness or distention, no masses by palpation, no abnormal pulsatility or arterial bruits, normal bowel sounds, no hepatosplenomegaly Extremities: no clubbing, cyanosis or edema; 2+ radial, ulnar and brachial pulses bilaterally; 2+ right femoral, posterior tibial and dorsalis pedis pulses; 2+ left femoral, posterior tibial and dorsalis pedis pulses; no subclavian or femoral bruits Neurological: grossly nonfocal Psych: euthymic mood, full affect   EKG:  EKG is ordered today. The ekg ordered today demonstrates  NSR   Recent Labs: No results found for requested labs within last 365 days.    Lipid Panel    Component Value Date/Time   CHOL  12/07/2009 0158    167        ATP III CLASSIFICATION:  <200     mg/dL   Desirable  709-628  mg/dL   Borderline High  >=366    mg/dL   High          TRIG 294* 12/07/2009 0158   HDL 45 12/07/2009 0158   CHOLHDL 3.7 12/07/2009 0158   VLDL 39 12/07/2009 0158   LDLCALC  12/07/2009 0158    83        Total Cholesterol/HDL:CHD Risk Coronary Heart Disease Risk Table                     Men   Women  1/2 Average Risk   3.4   3.3  Average Risk       5.0   4.4  2 X Average Risk   9.6   7.1  3 X Average Risk  23.4   11.0        Use the calculated Patient Ratio above and the CHD Risk Table to determine the patient's CHD Risk.        ATP III CLASSIFICATION (LDL):  <100     mg/dL   Optimal  765-465  mg/dL   Near or Above                    Optimal  130-159  mg/dL   Borderline  035-465  mg/dL   High  >681     mg/dL   Very High      Wt Readings from Last 3 Encounters:  07/01/15 209 lb (94.802 kg)  04/11/14 208 lb 6.4 oz (94.53 kg)  03/20/13 208 lb 8 oz (94.575 kg)      ASSESSMENT AND PLAN:  HTN  (hypertension)  Previously good control  On current medications.  Surgery check  with a home blood pressure monitor and call me if she notices that the systolics stay above 140.  Hyperlipidemia   seems to be intolerant of generic rosuvastatin. We'll try to keep her on brand-name Crestor. She can increase coenzyme Q 10 to 300 mg daily. Weight loss and increased physical exercise are  Still to be considered critical parts of therapy. Will get most recent results.  Morbid obesity  No progress in weight loss since last year's appointment.  She believes she is eating a very healthy diet and is frustrated by her inability to lose any weight  Tobacco abuse  Discussed need for smoking cessation. Does not appear to want to quit    Current medicines are reviewed at length with the patient today.  The patient does not have concerns regarding medicines.  The following changes have been made:  no change  Labs/ tests ordered today include:  No orders of the defined types were placed in this encounter.     Patient Instructions  Your physician has requested that you regularly monitor and record your blood pressure readings at home. Please use the same machine at the same time of day to check your readings and record them to bring to your follow-up visit.  Dr. Royann Shiversroitoru recommends that you schedule a follow-up appointment in: one year   BLOOD PRESSURE LOG Date: _______________________  a.m. _____________________  p.m. _____________________ Date: _______________________  a.m. _____________________  p.m. _____________________ Date: _______________________  a.m. _____________________  p.m. _____________________ Date: _______________________  a.m. _____________________  p.m. _____________________ Date: _______________________  a.m. _____________________  p.m. _____________________   This information is not intended to replace advice given to you by your health care provider.  Make sure you discuss any questions you have with your health care provider.   Document Released: 04/25/2003 Document Revised: 08/17/2014 Document Reviewed: 09/19/2013 Elsevier Interactive Patient Education [address] Elsevier [employer]         Monica BimlerSigned, Katianne Barre, MD  07/01/2015 9:36 AM    Thurmon FairMihai Montrelle Eddings, MD, [facility] [phone] office [phone] pager

## 2015-07-01 ENCOUNTER — Ambulatory Visit (INDEPENDENT_AMBULATORY_CARE_PROVIDER_SITE_OTHER): Payer: Medicare Other | Admitting: Cardiovascular Disease

## 2015-07-01 ENCOUNTER — Encounter: Payer: Self-pay | Admitting: Cardiovascular Disease

## 2015-07-01 VITALS — BP 148/70 | HR 65 | Resp 16 | Ht 59.0 in | Wt 209.0 lb

## 2015-07-01 DIAGNOSIS — E785 Hyperlipidemia, unspecified: Secondary | ICD-10-CM

## 2015-07-01 DIAGNOSIS — I10 Essential (primary) hypertension: Secondary | ICD-10-CM

## 2015-07-01 DIAGNOSIS — Z720 Tobacco use: Secondary | ICD-10-CM

## 2015-07-01 NOTE — Patient Instructions (Signed)
Your physician has requested that you regularly monitor and record your blood pressure readings at home. Please use the same machine at the same time of day to check your readings and record them to bring to your follow-up visit.  Dr. Royann Shiversroitoru recommends that you schedule a follow-up appointment in: one year   BLOOD PRESSURE LOG Date: _______________________  a.m. _____________________  p.m. _____________________ Date: _______________________  a.m. _____________________  p.m. _____________________ Date: _______________________  a.m. _____________________  p.m. _____________________ Date: _______________________  a.m. _____________________  p.m. _____________________ Date: _______________________  a.m. _____________________  p.m. _____________________   This information is not intended to replace advice given to you by your health care provider. Make sure you discuss any questions you have with your health care provider.   Document Released: 04/25/2003 Document Revised: 08/17/2014 Document Reviewed: 09/19/2013 Elsevier Interactive Patient Education [employer] Elsevier Inc.

## 2015-07-20 ENCOUNTER — Other Ambulatory Visit: Payer: Self-pay | Admitting: Cardiovascular Disease

## 2015-07-22 NOTE — Telephone Encounter (Signed)
REFILL 

## 2015-11-04 DIAGNOSIS — I872 Venous insufficiency (chronic) (peripheral): Secondary | ICD-10-CM | POA: Diagnosis not present

## 2015-11-04 DIAGNOSIS — Z6841 Body Mass Index (BMI) 40.0 and over, adult: Secondary | ICD-10-CM | POA: Diagnosis not present

## 2015-11-04 DIAGNOSIS — I10 Essential (primary) hypertension: Secondary | ICD-10-CM | POA: Diagnosis not present

## 2015-11-04 DIAGNOSIS — I83893 Varicose veins of bilateral lower extremities with other complications: Secondary | ICD-10-CM | POA: Diagnosis not present

## 2015-11-12 DIAGNOSIS — I10 Essential (primary) hypertension: Secondary | ICD-10-CM | POA: Diagnosis not present

## 2015-11-12 DIAGNOSIS — M859 Disorder of bone density and structure, unspecified: Secondary | ICD-10-CM | POA: Diagnosis not present

## 2015-11-12 DIAGNOSIS — E039 Hypothyroidism, unspecified: Secondary | ICD-10-CM | POA: Diagnosis not present

## 2015-11-12 DIAGNOSIS — E784 Other hyperlipidemia: Secondary | ICD-10-CM | POA: Diagnosis not present

## 2015-11-20 DIAGNOSIS — Z79890 Hormone replacement therapy (postmenopausal): Secondary | ICD-10-CM | POA: Diagnosis not present

## 2015-11-20 DIAGNOSIS — Z6841 Body Mass Index (BMI) 40.0 and over, adult: Secondary | ICD-10-CM | POA: Diagnosis not present

## 2015-11-20 DIAGNOSIS — N951 Menopausal and female climacteric states: Secondary | ICD-10-CM | POA: Diagnosis not present

## 2015-12-02 DIAGNOSIS — Z1231 Encounter for screening mammogram for malignant neoplasm of breast: Secondary | ICD-10-CM | POA: Diagnosis not present

## 2015-12-04 DIAGNOSIS — F17200 Nicotine dependence, unspecified, uncomplicated: Secondary | ICD-10-CM | POA: Diagnosis not present

## 2015-12-04 DIAGNOSIS — M8580 Other specified disorders of bone density and structure, unspecified site: Secondary | ICD-10-CM | POA: Diagnosis not present

## 2015-12-04 DIAGNOSIS — E784 Other hyperlipidemia: Secondary | ICD-10-CM | POA: Diagnosis not present

## 2015-12-04 DIAGNOSIS — Z6841 Body Mass Index (BMI) 40.0 and over, adult: Secondary | ICD-10-CM | POA: Diagnosis not present

## 2015-12-04 DIAGNOSIS — Z1389 Encounter for screening for other disorder: Secondary | ICD-10-CM | POA: Diagnosis not present

## 2015-12-04 DIAGNOSIS — K219 Gastro-esophageal reflux disease without esophagitis: Secondary | ICD-10-CM | POA: Diagnosis not present

## 2015-12-04 DIAGNOSIS — Z0000 Encounter for general adult medical examination without abnormal findings: Secondary | ICD-10-CM | POA: Diagnosis not present

## 2015-12-04 DIAGNOSIS — I83893 Varicose veins of bilateral lower extremities with other complications: Secondary | ICD-10-CM | POA: Diagnosis not present

## 2015-12-04 DIAGNOSIS — E039 Hypothyroidism, unspecified: Secondary | ICD-10-CM | POA: Diagnosis not present

## 2015-12-04 DIAGNOSIS — I10 Essential (primary) hypertension: Secondary | ICD-10-CM | POA: Diagnosis not present

## 2015-12-04 DIAGNOSIS — J309 Allergic rhinitis, unspecified: Secondary | ICD-10-CM | POA: Diagnosis not present

## 2015-12-04 DIAGNOSIS — K635 Polyp of colon: Secondary | ICD-10-CM | POA: Diagnosis not present

## 2015-12-23 DIAGNOSIS — R05 Cough: Secondary | ICD-10-CM | POA: Diagnosis not present

## 2015-12-23 DIAGNOSIS — J301 Allergic rhinitis due to pollen: Secondary | ICD-10-CM | POA: Diagnosis not present

## 2015-12-23 DIAGNOSIS — Z6841 Body Mass Index (BMI) 40.0 and over, adult: Secondary | ICD-10-CM | POA: Diagnosis not present

## 2015-12-23 DIAGNOSIS — J0100 Acute maxillary sinusitis, unspecified: Secondary | ICD-10-CM | POA: Diagnosis not present

## 2015-12-23 DIAGNOSIS — F17200 Nicotine dependence, unspecified, uncomplicated: Secondary | ICD-10-CM | POA: Diagnosis not present

## 2016-01-09 ENCOUNTER — Encounter: Payer: Self-pay | Admitting: Cardiovascular Disease

## 2016-02-27 DIAGNOSIS — M25521 Pain in right elbow: Secondary | ICD-10-CM | POA: Diagnosis not present

## 2016-02-27 DIAGNOSIS — F17210 Nicotine dependence, cigarettes, uncomplicated: Secondary | ICD-10-CM | POA: Diagnosis not present

## 2016-02-27 DIAGNOSIS — S43014A Anterior dislocation of right humerus, initial encounter: Secondary | ICD-10-CM | POA: Diagnosis not present

## 2016-02-27 DIAGNOSIS — M79601 Pain in right arm: Secondary | ICD-10-CM | POA: Diagnosis not present

## 2016-02-27 DIAGNOSIS — W010XXA Fall on same level from slipping, tripping and stumbling without subsequent striking against object, initial encounter: Secondary | ICD-10-CM | POA: Diagnosis not present

## 2016-02-27 DIAGNOSIS — S43004A Unspecified dislocation of right shoulder joint, initial encounter: Secondary | ICD-10-CM | POA: Diagnosis not present

## 2016-02-27 DIAGNOSIS — S59901A Unspecified injury of right elbow, initial encounter: Secondary | ICD-10-CM | POA: Diagnosis not present

## 2016-03-03 DIAGNOSIS — M25511 Pain in right shoulder: Secondary | ICD-10-CM | POA: Diagnosis not present

## 2016-03-09 DIAGNOSIS — M25511 Pain in right shoulder: Secondary | ICD-10-CM | POA: Diagnosis not present

## 2016-03-16 DIAGNOSIS — S42291A Other displaced fracture of upper end of right humerus, initial encounter for closed fracture: Secondary | ICD-10-CM | POA: Diagnosis not present

## 2016-03-16 DIAGNOSIS — S46011A Strain of muscle(s) and tendon(s) of the rotator cuff of right shoulder, initial encounter: Secondary | ICD-10-CM | POA: Diagnosis not present

## 2016-03-16 DIAGNOSIS — S43004A Unspecified dislocation of right shoulder joint, initial encounter: Secondary | ICD-10-CM | POA: Diagnosis not present

## 2016-03-16 DIAGNOSIS — S43431A Superior glenoid labrum lesion of right shoulder, initial encounter: Secondary | ICD-10-CM | POA: Diagnosis not present

## 2016-03-18 DIAGNOSIS — S43004D Unspecified dislocation of right shoulder joint, subsequent encounter: Secondary | ICD-10-CM | POA: Diagnosis not present

## 2016-03-24 DIAGNOSIS — S43004D Unspecified dislocation of right shoulder joint, subsequent encounter: Secondary | ICD-10-CM | POA: Diagnosis not present

## 2016-03-31 DIAGNOSIS — S43004D Unspecified dislocation of right shoulder joint, subsequent encounter: Secondary | ICD-10-CM | POA: Diagnosis not present

## 2016-04-06 DIAGNOSIS — M25521 Pain in right elbow: Secondary | ICD-10-CM | POA: Diagnosis not present

## 2016-04-06 DIAGNOSIS — S43431D Superior glenoid labrum lesion of right shoulder, subsequent encounter: Secondary | ICD-10-CM | POA: Diagnosis not present

## 2016-04-06 DIAGNOSIS — S46011D Strain of muscle(s) and tendon(s) of the rotator cuff of right shoulder, subsequent encounter: Secondary | ICD-10-CM | POA: Diagnosis not present

## 2016-04-07 DIAGNOSIS — S43004D Unspecified dislocation of right shoulder joint, subsequent encounter: Secondary | ICD-10-CM | POA: Diagnosis not present

## 2016-04-14 DIAGNOSIS — S43004D Unspecified dislocation of right shoulder joint, subsequent encounter: Secondary | ICD-10-CM | POA: Diagnosis not present

## 2016-04-28 DIAGNOSIS — Y999 Unspecified external cause status: Secondary | ICD-10-CM | POA: Diagnosis not present

## 2016-04-28 DIAGNOSIS — G8918 Other acute postprocedural pain: Secondary | ICD-10-CM | POA: Diagnosis not present

## 2016-04-28 DIAGNOSIS — S43491A Other sprain of right shoulder joint, initial encounter: Secondary | ICD-10-CM | POA: Diagnosis not present

## 2016-04-28 DIAGNOSIS — S46111A Strain of muscle, fascia and tendon of long head of biceps, right arm, initial encounter: Secondary | ICD-10-CM | POA: Diagnosis not present

## 2016-04-28 DIAGNOSIS — S46191A Other injury of muscle, fascia and tendon of long head of biceps, right arm, initial encounter: Secondary | ICD-10-CM | POA: Diagnosis not present

## 2016-04-28 DIAGNOSIS — M25511 Pain in right shoulder: Secondary | ICD-10-CM | POA: Diagnosis not present

## 2016-04-28 DIAGNOSIS — S46011A Strain of muscle(s) and tendon(s) of the rotator cuff of right shoulder, initial encounter: Secondary | ICD-10-CM | POA: Diagnosis not present

## 2016-04-28 DIAGNOSIS — M19011 Primary osteoarthritis, right shoulder: Secondary | ICD-10-CM | POA: Diagnosis not present

## 2016-04-28 DIAGNOSIS — S43011A Anterior subluxation of right humerus, initial encounter: Secondary | ICD-10-CM | POA: Diagnosis not present

## 2016-04-28 DIAGNOSIS — S46101A Unspecified injury of muscle, fascia and tendon of long head of biceps, right arm, initial encounter: Secondary | ICD-10-CM | POA: Diagnosis not present

## 2016-05-06 DIAGNOSIS — S46011D Strain of muscle(s) and tendon(s) of the rotator cuff of right shoulder, subsequent encounter: Secondary | ICD-10-CM | POA: Diagnosis not present

## 2016-05-12 DIAGNOSIS — S46011D Strain of muscle(s) and tendon(s) of the rotator cuff of right shoulder, subsequent encounter: Secondary | ICD-10-CM | POA: Diagnosis not present

## 2016-05-14 DIAGNOSIS — S46011D Strain of muscle(s) and tendon(s) of the rotator cuff of right shoulder, subsequent encounter: Secondary | ICD-10-CM | POA: Diagnosis not present

## 2016-05-19 DIAGNOSIS — S46011D Strain of muscle(s) and tendon(s) of the rotator cuff of right shoulder, subsequent encounter: Secondary | ICD-10-CM | POA: Diagnosis not present

## 2016-05-21 DIAGNOSIS — S46011D Strain of muscle(s) and tendon(s) of the rotator cuff of right shoulder, subsequent encounter: Secondary | ICD-10-CM | POA: Diagnosis not present

## 2016-05-26 DIAGNOSIS — S46011D Strain of muscle(s) and tendon(s) of the rotator cuff of right shoulder, subsequent encounter: Secondary | ICD-10-CM | POA: Diagnosis not present

## 2016-05-28 DIAGNOSIS — S46011D Strain of muscle(s) and tendon(s) of the rotator cuff of right shoulder, subsequent encounter: Secondary | ICD-10-CM | POA: Diagnosis not present

## 2016-06-03 DIAGNOSIS — Z4789 Encounter for other orthopedic aftercare: Secondary | ICD-10-CM | POA: Diagnosis not present

## 2016-06-03 DIAGNOSIS — S46011D Strain of muscle(s) and tendon(s) of the rotator cuff of right shoulder, subsequent encounter: Secondary | ICD-10-CM | POA: Diagnosis not present

## 2016-06-05 DIAGNOSIS — S46011D Strain of muscle(s) and tendon(s) of the rotator cuff of right shoulder, subsequent encounter: Secondary | ICD-10-CM | POA: Diagnosis not present

## 2016-06-09 DIAGNOSIS — S46011D Strain of muscle(s) and tendon(s) of the rotator cuff of right shoulder, subsequent encounter: Secondary | ICD-10-CM | POA: Diagnosis not present

## 2016-06-10 ENCOUNTER — Ambulatory Visit (INDEPENDENT_AMBULATORY_CARE_PROVIDER_SITE_OTHER): Payer: Medicare Other | Admitting: Cardiovascular Disease

## 2016-06-10 ENCOUNTER — Encounter: Payer: Self-pay | Admitting: Cardiovascular Disease

## 2016-06-10 VITALS — BP 114/50 | HR 80 | Ht 59.0 in | Wt 200.0 lb

## 2016-06-10 DIAGNOSIS — I2510 Atherosclerotic heart disease of native coronary artery without angina pectoris: Secondary | ICD-10-CM | POA: Diagnosis not present

## 2016-06-10 DIAGNOSIS — I10 Essential (primary) hypertension: Secondary | ICD-10-CM

## 2016-06-10 DIAGNOSIS — E782 Mixed hyperlipidemia: Secondary | ICD-10-CM | POA: Diagnosis not present

## 2016-06-10 DIAGNOSIS — Z720 Tobacco use: Secondary | ICD-10-CM

## 2016-06-10 NOTE — Progress Notes (Signed)
Cardiology Office Note    Date:  06/12/2016   ID:  Monica LazarMargaret E Remus, DOB 05/07/1946, MRN [id number]  PCP:  Hoyle SauerAVVA,RAVISANKAR R, MD  Cardiologist:   Thurmon FairMihai Masiyah Jorstad, MD   Chief Complaint  Patient presents with  . Follow-up    1 YEAR    History of Present Illness:  Monica Cordova is a 70 y.o. female with mild nonobstructive atherosclerosis by previous coronary angiography, hypertension, hyperlipidemia and peripheral venous insufficiency returning for follow-up. She has morbid obesity with a BMI at 40, although she has lost 10 pounds in the last year. Unfortunately she had a fall with a shoulder dislocation and multiple fractures. She seen Dr. Thomasena Edisollins. This has limited her ability to exercise. She has also been unable to work. She smokes an occasional cigarette. She still takes estradiol supplements.  The patient specifically denies any chest pain at rest or with exertion, dyspnea at rest or with exertion, orthopnea, paroxysmal nocturnal dyspnea, syncope, palpitations, focal neurological deficits, intermittent claudication, lower extremity edema, unexplained weight gain, cough, hemoptysis or wheezing.  Recent lipid profile showed total cholesterol 177, triglycerides 300, HDL 37, LDL 80, hemoglobin A1c 5.4%, glucose 98, creatinine 1.0, TSH 1.00.   Past Medical History:  Diagnosis Date  . Hyperlipemia   . Hypertension     Past Surgical History:  Procedure Laterality Date  . ABDOMINAL HYSTERECTOMY  2002  . BLADDER SURGERY  1995  . CARDIAC CATHETERIZATION  08/11/2000   no significant CAD  . GANGLION CYST EXCISION  1970   right arm  . NM MYOCAR PERF WALL MOTION  10/18/2006   no significant ischemia  . OVARY SURGERY  1995    Current Medications: Outpatient Medications Prior to Visit  Medication Sig Dispense Refill  . Coenzyme Q10 (CO Q 10) 100 MG CAPS Take 200 mg by mouth daily.     . CRESTOR 10 MG tablet TAKE 1 TABLET BY MOUTH EVERY DAY 30 tablet 11  . DIGESTIVE ENZYMES PO  Take 2 tablets by mouth 2 (two) times daily before a meal.     . estradiol (ESTRACE) 0.1 MG/GM vaginal cream Place 2 g vaginally 2 (two) times a week.    . estradiol (VIVELLE-DOT) 0.0375 MG/24HR Place 1 patch onto the skin once a week.     . levothyroxine (SYNTHROID, LEVOTHROID) 50 MCG tablet Take 50 mcg by mouth 2 (two) times daily.     Marland Kitchen. losartan (COZAAR) 50 MG tablet Take 50 mg by mouth 2 (two) times daily.    . Probiotic Product (PROBIOTIC DAILY PO) Take 1 capsule by mouth daily.    . vitamin C (ASCORBIC ACID) 500 MG tablet Take 500 mg by mouth daily.    Marland Kitchen. EPIPEN 2-PAK 0.3 MG/0.3ML SOAJ injection As Needed    . omeprazole (PRILOSEC OTC) 20 MG tablet Take 20 mg by mouth daily.     No facility-administered medications prior to visit.      Allergies:   Antihistamines, diphenhydramine-type and Codeine   Social History   Social History  . Marital status: Divorced    Spouse name: N/A  . Number of children: N/A  . Years of education: N/A   Social History Main Topics  . Smoking status: Light Tobacco Smoker    Years: 46.00    Types: Cigarettes  . Smokeless tobacco: Never Used  . Alcohol use 0.6 - 1.2 oz/week    1 - 2 Glasses of wine per week  . Drug use: No  . Sexual activity: Not  Asked   Other Topics Concern  . None   Social History Narrative  . None     Family History:  The patient's family history includes Hyperlipidemia in her brother and mother.   ROS:   Please see the history of present illness.    ROS All other systems reviewed and are negative.   PHYSICAL EXAM:   VS:  BP (!) 114/50   Pulse 80   Ht 4\' 11"  (1.499 m)   Wt 200 lb (90.7 kg)   BMI 40.40 kg/m    GEN: Morbid obesity, well developed, in no acute distress  HEENT: normal  Neck: no JVD, carotid bruits, or masses Cardiac: RRR; no murmurs, rubs, or gallops,no edema  Respiratory:  clear to auscultation bilaterally, normal work of breathing GI: soft, nontender, nondistended, + BS MS: no deformity or  atrophy  Skin: warm and dry, no rash, varicose veins without leg edema or cutaneous ulcers Neuro:  Alert and Oriented x 3, Strength and sensation are intact Psych: euthymic mood, full affect  Wt Readings from Last 3 Encounters:  06/10/16 200 lb (90.7 kg)  07/01/15 209 lb (94.8 kg)  04/11/14 208 lb 6.4 oz (94.5 kg)      Studies/Labs Reviewed:   EKG:  EKG is ordered today.  The ekg ordered today demonstrates Normal sinus rhythm, QS pattern across the anterior precordium with generalized low voltage, likely due to obesity, QTC 433 ms  Recent Labs: total cholesterol 177, triglycerides 300, HDL 37, LDL 80, hemoglobin A1c 5.4%, glucose 98, creatinine 1.0   ASSESSMENT:    1. Essential hypertension   2. Atherosclerosis of native coronary artery of native heart without angina pectoris   3. Mixed hyperlipidemia   4. Morbid obesity (HCC)   5. Tobacco abuse      PLAN:  In order of problems listed above:  1. Coronary atherosclerosis: Nonobstructive disease in all 3 major coronary territories by remote cardiac catheterization, she does not have angina pectoris, low risk nuclear stress test last performed in 2008. He focus veins on risk factor modification. 2. HTN: Great control 3. HLP: All lipid parameters within target range with the exception of elevated triglycerides and low HDL. Now seems to be tolerating the statin without muscular side effects. cholesterol which are markers for hyperinsulinemia and increased vascular risk. I don't think adding a fiber 8 to her statins justified, but do recommend a calorie/carbohydrate restricted diet and more physical activity once she recovers from her injury. 4. Morbid obesity: Congratulated on her weight loss over the last year but she still has a long way to go. 5. Smoking: Even though she only smokes occasionally, advised her to stop completely. I would definitely strongly recommend against continued smoking as she takes an estrogen supplement. May  also want to discuss the risk benefit balance of continued estrogen supplements as they increase the risk of venous and arterial thrombotic complications.    Medication Adjustments/Labs and Tests Ordered: Current medicines are reviewed at length with the patient today.  Concerns regarding medicines are outlined above.  Medication changes, Labs and Tests ordered today are listed in the Patient Instructions below. Patient Instructions  Follow-Up: Your physician recommends that you schedule a follow-up appointment in: 12 MONTHS WITH DR Royann ShiversROITORU   Any Other Special Instructions Will Be Listed Below (If Applicable). Your physician encouraged you to lose weight for better health.   If you need a refill on your cardiac medications before your next appointment, please call your pharmacy.  Signed, Thurmon Fair, MD  06/12/2016 10:28 AM    [facility] [address] [place], [place], [place]  [address] Phone: [phone]; Fax: [phone]

## 2016-06-10 NOTE — Patient Instructions (Signed)
Follow-Up: Your physician recommends that you schedule a follow-up appointment in: 12 MONTHS WITH DR Royann ShiversROITORU   Any Other Special Instructions Will Be Listed Below (If Applicable). Your physician encouraged you to lose weight for better health.   If you need a refill on your cardiac medications before your next appointment, please call your pharmacy.

## 2016-06-12 DIAGNOSIS — I2510 Atherosclerotic heart disease of native coronary artery without angina pectoris: Secondary | ICD-10-CM | POA: Insufficient documentation

## 2016-06-12 DIAGNOSIS — S46011D Strain of muscle(s) and tendon(s) of the rotator cuff of right shoulder, subsequent encounter: Secondary | ICD-10-CM | POA: Diagnosis not present

## 2016-06-16 DIAGNOSIS — S46011D Strain of muscle(s) and tendon(s) of the rotator cuff of right shoulder, subsequent encounter: Secondary | ICD-10-CM | POA: Diagnosis not present

## 2016-06-17 DIAGNOSIS — E038 Other specified hypothyroidism: Secondary | ICD-10-CM | POA: Diagnosis not present

## 2016-06-17 DIAGNOSIS — F17200 Nicotine dependence, unspecified, uncomplicated: Secondary | ICD-10-CM | POA: Diagnosis not present

## 2016-06-17 DIAGNOSIS — I10 Essential (primary) hypertension: Secondary | ICD-10-CM | POA: Diagnosis not present

## 2016-06-17 DIAGNOSIS — M96621 Fracture of humerus following insertion of orthopedic implant, joint prosthesis, or bone plate, right arm: Secondary | ICD-10-CM | POA: Diagnosis not present

## 2016-06-17 DIAGNOSIS — E784 Other hyperlipidemia: Secondary | ICD-10-CM | POA: Diagnosis not present

## 2016-06-17 DIAGNOSIS — Z6841 Body Mass Index (BMI) 40.0 and over, adult: Secondary | ICD-10-CM | POA: Diagnosis not present

## 2016-06-17 DIAGNOSIS — E669 Obesity, unspecified: Secondary | ICD-10-CM | POA: Diagnosis not present

## 2016-06-17 DIAGNOSIS — I2510 Atherosclerotic heart disease of native coronary artery without angina pectoris: Secondary | ICD-10-CM | POA: Diagnosis not present

## 2016-06-18 DIAGNOSIS — S46011D Strain of muscle(s) and tendon(s) of the rotator cuff of right shoulder, subsequent encounter: Secondary | ICD-10-CM | POA: Diagnosis not present

## 2016-06-23 DIAGNOSIS — S46011D Strain of muscle(s) and tendon(s) of the rotator cuff of right shoulder, subsequent encounter: Secondary | ICD-10-CM | POA: Diagnosis not present

## 2016-06-25 DIAGNOSIS — S46011D Strain of muscle(s) and tendon(s) of the rotator cuff of right shoulder, subsequent encounter: Secondary | ICD-10-CM | POA: Diagnosis not present

## 2016-06-26 ENCOUNTER — Telehealth: Payer: Self-pay | Admitting: Cardiovascular Disease

## 2016-06-26 NOTE — Telephone Encounter (Signed)
Spoke with pt states that she went to refill her crestor and received generic crestor last year when she took it she had BUE an BLE muscle aching and had to go to [employer] brand name Crestor. She has been getting this all year now its $300. Pt needs prior auth started, pt is out of medication. I tried to start PA but her part D is not coming up  in the Cover MM system. Please help

## 2016-06-26 NOTE — Telephone Encounter (Signed)
Please call,pt says she does not want to take the generic Crestor.

## 2016-06-30 DIAGNOSIS — S46011D Strain of muscle(s) and tendon(s) of the rotator cuff of right shoulder, subsequent encounter: Secondary | ICD-10-CM | POA: Diagnosis not present

## 2016-07-01 NOTE — Telephone Encounter (Signed)
Called patient for prescription drug coverage information.  [employer] Member ID - [id number] BIN - [id number] PCN - [id number] Rx Group - [id number]  Prior authorization for Crestor 10 mg faxed to patient's insurance company via [email or web address]. Awaiting approval.

## 2016-07-07 DIAGNOSIS — S46011D Strain of muscle(s) and tendon(s) of the rotator cuff of right shoulder, subsequent encounter: Secondary | ICD-10-CM | POA: Diagnosis not present

## 2016-07-08 DIAGNOSIS — S46011D Strain of muscle(s) and tendon(s) of the rotator cuff of right shoulder, subsequent encounter: Secondary | ICD-10-CM | POA: Diagnosis not present

## 2016-07-08 DIAGNOSIS — Z4789 Encounter for other orthopedic aftercare: Secondary | ICD-10-CM | POA: Diagnosis not present

## 2016-07-09 DIAGNOSIS — S46011D Strain of muscle(s) and tendon(s) of the rotator cuff of right shoulder, subsequent encounter: Secondary | ICD-10-CM | POA: Diagnosis not present

## 2016-07-13 DIAGNOSIS — S46011D Strain of muscle(s) and tendon(s) of the rotator cuff of right shoulder, subsequent encounter: Secondary | ICD-10-CM | POA: Diagnosis not present

## 2016-07-16 DIAGNOSIS — S46011D Strain of muscle(s) and tendon(s) of the rotator cuff of right shoulder, subsequent encounter: Secondary | ICD-10-CM | POA: Diagnosis not present

## 2016-07-20 DIAGNOSIS — Z4789 Encounter for other orthopedic aftercare: Secondary | ICD-10-CM | POA: Diagnosis not present

## 2016-07-20 DIAGNOSIS — S46011D Strain of muscle(s) and tendon(s) of the rotator cuff of right shoulder, subsequent encounter: Secondary | ICD-10-CM | POA: Diagnosis not present

## 2016-07-27 DIAGNOSIS — S46011D Strain of muscle(s) and tendon(s) of the rotator cuff of right shoulder, subsequent encounter: Secondary | ICD-10-CM | POA: Diagnosis not present

## 2016-07-31 DIAGNOSIS — S46011D Strain of muscle(s) and tendon(s) of the rotator cuff of right shoulder, subsequent encounter: Secondary | ICD-10-CM | POA: Diagnosis not present

## 2016-07-31 DIAGNOSIS — Z4789 Encounter for other orthopedic aftercare: Secondary | ICD-10-CM | POA: Diagnosis not present

## 2016-08-25 DIAGNOSIS — M25511 Pain in right shoulder: Secondary | ICD-10-CM | POA: Diagnosis not present

## 2016-08-26 DIAGNOSIS — M25511 Pain in right shoulder: Secondary | ICD-10-CM | POA: Diagnosis not present

## 2016-08-31 DIAGNOSIS — M25511 Pain in right shoulder: Secondary | ICD-10-CM | POA: Diagnosis not present

## 2016-09-02 DIAGNOSIS — M25511 Pain in right shoulder: Secondary | ICD-10-CM | POA: Diagnosis not present

## 2016-09-04 DIAGNOSIS — M25511 Pain in right shoulder: Secondary | ICD-10-CM | POA: Diagnosis not present

## 2016-09-09 DIAGNOSIS — M25511 Pain in right shoulder: Secondary | ICD-10-CM | POA: Diagnosis not present

## 2016-09-15 DIAGNOSIS — Z4789 Encounter for other orthopedic aftercare: Secondary | ICD-10-CM | POA: Diagnosis not present

## 2016-09-15 DIAGNOSIS — S46011D Strain of muscle(s) and tendon(s) of the rotator cuff of right shoulder, subsequent encounter: Secondary | ICD-10-CM | POA: Diagnosis not present

## 2016-09-16 ENCOUNTER — Other Ambulatory Visit: Payer: Self-pay | Admitting: Cardiovascular Disease

## 2016-09-16 DIAGNOSIS — M25511 Pain in right shoulder: Secondary | ICD-10-CM | POA: Diagnosis not present

## 2016-09-17 DIAGNOSIS — M25511 Pain in right shoulder: Secondary | ICD-10-CM | POA: Diagnosis not present

## 2016-09-18 DIAGNOSIS — M25511 Pain in right shoulder: Secondary | ICD-10-CM | POA: Diagnosis not present

## 2016-09-23 DIAGNOSIS — M25511 Pain in right shoulder: Secondary | ICD-10-CM | POA: Diagnosis not present

## 2016-09-25 DIAGNOSIS — M25511 Pain in right shoulder: Secondary | ICD-10-CM | POA: Diagnosis not present

## 2016-09-28 DIAGNOSIS — M25511 Pain in right shoulder: Secondary | ICD-10-CM | POA: Diagnosis not present

## 2016-09-30 DIAGNOSIS — M25511 Pain in right shoulder: Secondary | ICD-10-CM | POA: Diagnosis not present

## 2016-10-02 DIAGNOSIS — M25511 Pain in right shoulder: Secondary | ICD-10-CM | POA: Diagnosis not present

## 2016-10-05 DIAGNOSIS — M25511 Pain in right shoulder: Secondary | ICD-10-CM | POA: Diagnosis not present

## 2016-10-07 DIAGNOSIS — M25511 Pain in right shoulder: Secondary | ICD-10-CM | POA: Diagnosis not present

## 2016-10-09 DIAGNOSIS — M25511 Pain in right shoulder: Secondary | ICD-10-CM | POA: Diagnosis not present

## 2016-10-12 DIAGNOSIS — S46011D Strain of muscle(s) and tendon(s) of the rotator cuff of right shoulder, subsequent encounter: Secondary | ICD-10-CM | POA: Diagnosis not present

## 2016-10-12 DIAGNOSIS — Z4789 Encounter for other orthopedic aftercare: Secondary | ICD-10-CM | POA: Diagnosis not present

## 2016-10-14 DIAGNOSIS — M25511 Pain in right shoulder: Secondary | ICD-10-CM | POA: Diagnosis not present

## 2016-10-15 DIAGNOSIS — M25511 Pain in right shoulder: Secondary | ICD-10-CM | POA: Diagnosis not present

## 2016-10-19 DIAGNOSIS — M25511 Pain in right shoulder: Secondary | ICD-10-CM | POA: Diagnosis not present

## 2016-10-21 DIAGNOSIS — M25511 Pain in right shoulder: Secondary | ICD-10-CM | POA: Diagnosis not present

## 2016-10-23 DIAGNOSIS — M25511 Pain in right shoulder: Secondary | ICD-10-CM | POA: Diagnosis not present

## 2016-10-26 DIAGNOSIS — M25511 Pain in right shoulder: Secondary | ICD-10-CM | POA: Diagnosis not present

## 2016-11-03 DIAGNOSIS — M25511 Pain in right shoulder: Secondary | ICD-10-CM | POA: Diagnosis not present

## 2016-11-04 DIAGNOSIS — M25511 Pain in right shoulder: Secondary | ICD-10-CM | POA: Diagnosis not present

## 2016-11-09 DIAGNOSIS — M25511 Pain in right shoulder: Secondary | ICD-10-CM | POA: Diagnosis not present

## 2016-11-16 DIAGNOSIS — M25511 Pain in right shoulder: Secondary | ICD-10-CM | POA: Diagnosis not present

## 2016-11-19 DIAGNOSIS — M25511 Pain in right shoulder: Secondary | ICD-10-CM | POA: Diagnosis not present

## 2016-11-24 DIAGNOSIS — M25511 Pain in right shoulder: Secondary | ICD-10-CM | POA: Diagnosis not present

## 2016-11-27 DIAGNOSIS — M25511 Pain in right shoulder: Secondary | ICD-10-CM | POA: Diagnosis not present

## 2016-11-30 DIAGNOSIS — M25511 Pain in right shoulder: Secondary | ICD-10-CM | POA: Diagnosis not present

## 2016-12-01 DIAGNOSIS — H2511 Age-related nuclear cataract, right eye: Secondary | ICD-10-CM | POA: Diagnosis not present

## 2016-12-01 DIAGNOSIS — Z961 Presence of intraocular lens: Secondary | ICD-10-CM | POA: Diagnosis not present

## 2016-12-03 DIAGNOSIS — M25511 Pain in right shoulder: Secondary | ICD-10-CM | POA: Diagnosis not present

## 2016-12-07 DIAGNOSIS — Z4789 Encounter for other orthopedic aftercare: Secondary | ICD-10-CM | POA: Diagnosis not present

## 2016-12-07 DIAGNOSIS — S46011D Strain of muscle(s) and tendon(s) of the rotator cuff of right shoulder, subsequent encounter: Secondary | ICD-10-CM | POA: Diagnosis not present

## 2016-12-08 DIAGNOSIS — M25511 Pain in right shoulder: Secondary | ICD-10-CM | POA: Diagnosis not present

## 2016-12-11 DIAGNOSIS — M25511 Pain in right shoulder: Secondary | ICD-10-CM | POA: Diagnosis not present

## 2016-12-14 DIAGNOSIS — M25511 Pain in right shoulder: Secondary | ICD-10-CM | POA: Diagnosis not present

## 2016-12-18 DIAGNOSIS — M25511 Pain in right shoulder: Secondary | ICD-10-CM | POA: Diagnosis not present

## 2016-12-21 DIAGNOSIS — M25511 Pain in right shoulder: Secondary | ICD-10-CM | POA: Diagnosis not present

## 2016-12-23 DIAGNOSIS — M25511 Pain in right shoulder: Secondary | ICD-10-CM | POA: Diagnosis not present

## 2016-12-28 DIAGNOSIS — M25511 Pain in right shoulder: Secondary | ICD-10-CM | POA: Diagnosis not present

## 2016-12-31 DIAGNOSIS — M25511 Pain in right shoulder: Secondary | ICD-10-CM | POA: Diagnosis not present

## 2017-01-05 DIAGNOSIS — M25511 Pain in right shoulder: Secondary | ICD-10-CM | POA: Diagnosis not present

## 2017-01-07 DIAGNOSIS — M25511 Pain in right shoulder: Secondary | ICD-10-CM | POA: Diagnosis not present

## 2017-01-11 DIAGNOSIS — Z4789 Encounter for other orthopedic aftercare: Secondary | ICD-10-CM | POA: Diagnosis not present

## 2017-01-11 DIAGNOSIS — S46011D Strain of muscle(s) and tendon(s) of the rotator cuff of right shoulder, subsequent encounter: Secondary | ICD-10-CM | POA: Diagnosis not present

## 2017-01-12 DIAGNOSIS — M25511 Pain in right shoulder: Secondary | ICD-10-CM | POA: Diagnosis not present

## 2017-01-15 DIAGNOSIS — M25511 Pain in right shoulder: Secondary | ICD-10-CM | POA: Diagnosis not present

## 2017-01-18 DIAGNOSIS — M25511 Pain in right shoulder: Secondary | ICD-10-CM | POA: Diagnosis not present

## 2017-01-22 DIAGNOSIS — M25511 Pain in right shoulder: Secondary | ICD-10-CM | POA: Diagnosis not present

## 2017-01-25 DIAGNOSIS — M25511 Pain in right shoulder: Secondary | ICD-10-CM | POA: Diagnosis not present

## 2017-01-27 DIAGNOSIS — M25511 Pain in right shoulder: Secondary | ICD-10-CM | POA: Diagnosis not present

## 2017-02-01 DIAGNOSIS — M25511 Pain in right shoulder: Secondary | ICD-10-CM | POA: Diagnosis not present

## 2017-02-03 DIAGNOSIS — M25511 Pain in right shoulder: Secondary | ICD-10-CM | POA: Diagnosis not present

## 2017-03-03 DIAGNOSIS — H6123 Impacted cerumen, bilateral: Secondary | ICD-10-CM | POA: Diagnosis not present

## 2017-03-03 DIAGNOSIS — H60543 Acute eczematoid otitis externa, bilateral: Secondary | ICD-10-CM | POA: Diagnosis not present

## 2017-04-20 DIAGNOSIS — E039 Hypothyroidism, unspecified: Secondary | ICD-10-CM | POA: Diagnosis not present

## 2017-04-20 DIAGNOSIS — I10 Essential (primary) hypertension: Secondary | ICD-10-CM | POA: Diagnosis not present

## 2017-04-20 DIAGNOSIS — Z713 Dietary counseling and surveillance: Secondary | ICD-10-CM | POA: Diagnosis not present

## 2017-04-20 DIAGNOSIS — Z6841 Body Mass Index (BMI) 40.0 and over, adult: Secondary | ICD-10-CM | POA: Diagnosis not present

## 2017-04-20 DIAGNOSIS — Z139 Encounter for screening, unspecified: Secondary | ICD-10-CM | POA: Diagnosis not present

## 2017-05-04 DIAGNOSIS — Z6841 Body Mass Index (BMI) 40.0 and over, adult: Secondary | ICD-10-CM | POA: Diagnosis not present

## 2017-05-04 DIAGNOSIS — Z7689 Persons encountering health services in other specified circumstances: Secondary | ICD-10-CM | POA: Diagnosis not present

## 2017-05-04 DIAGNOSIS — E039 Hypothyroidism, unspecified: Secondary | ICD-10-CM | POA: Diagnosis not present

## 2017-05-04 DIAGNOSIS — I10 Essential (primary) hypertension: Secondary | ICD-10-CM | POA: Diagnosis not present

## 2017-06-16 DIAGNOSIS — Z01419 Encounter for gynecological examination (general) (routine) without abnormal findings: Secondary | ICD-10-CM | POA: Diagnosis not present

## 2017-06-21 DIAGNOSIS — Z0000 Encounter for general adult medical examination without abnormal findings: Secondary | ICD-10-CM | POA: Diagnosis not present

## 2017-06-21 DIAGNOSIS — Z2821 Immunization not carried out because of patient refusal: Secondary | ICD-10-CM | POA: Diagnosis not present

## 2017-06-21 DIAGNOSIS — Z6841 Body Mass Index (BMI) 40.0 and over, adult: Secondary | ICD-10-CM | POA: Diagnosis not present

## 2017-06-21 DIAGNOSIS — E782 Mixed hyperlipidemia: Secondary | ICD-10-CM | POA: Diagnosis not present

## 2017-06-21 DIAGNOSIS — Z1231 Encounter for screening mammogram for malignant neoplasm of breast: Secondary | ICD-10-CM | POA: Diagnosis not present

## 2017-06-21 DIAGNOSIS — Z1211 Encounter for screening for malignant neoplasm of colon: Secondary | ICD-10-CM | POA: Diagnosis not present

## 2017-06-21 DIAGNOSIS — I10 Essential (primary) hypertension: Secondary | ICD-10-CM | POA: Diagnosis not present

## 2017-07-05 DIAGNOSIS — E782 Mixed hyperlipidemia: Secondary | ICD-10-CM | POA: Diagnosis not present

## 2017-07-05 DIAGNOSIS — Z6841 Body Mass Index (BMI) 40.0 and over, adult: Secondary | ICD-10-CM | POA: Diagnosis not present

## 2017-07-05 DIAGNOSIS — Z7689 Persons encountering health services in other specified circumstances: Secondary | ICD-10-CM | POA: Diagnosis not present

## 2017-07-05 DIAGNOSIS — Z713 Dietary counseling and surveillance: Secondary | ICD-10-CM | POA: Diagnosis not present

## 2017-07-05 DIAGNOSIS — Z0289 Encounter for other administrative examinations: Secondary | ICD-10-CM | POA: Diagnosis not present

## 2017-07-05 DIAGNOSIS — I10 Essential (primary) hypertension: Secondary | ICD-10-CM | POA: Diagnosis not present

## 2017-07-05 DIAGNOSIS — E039 Hypothyroidism, unspecified: Secondary | ICD-10-CM | POA: Diagnosis not present

## 2017-07-12 DIAGNOSIS — M25511 Pain in right shoulder: Secondary | ICD-10-CM | POA: Diagnosis not present

## 2017-07-12 DIAGNOSIS — S43491A Other sprain of right shoulder joint, initial encounter: Secondary | ICD-10-CM | POA: Diagnosis not present

## 2017-07-26 DIAGNOSIS — Z1231 Encounter for screening mammogram for malignant neoplasm of breast: Secondary | ICD-10-CM | POA: Diagnosis not present

## 2017-08-11 DIAGNOSIS — Z6829 Body mass index (BMI) 29.0-29.9, adult: Secondary | ICD-10-CM | POA: Diagnosis not present

## 2017-08-11 DIAGNOSIS — I10 Essential (primary) hypertension: Secondary | ICD-10-CM | POA: Diagnosis not present

## 2017-08-11 DIAGNOSIS — J040 Acute laryngitis: Secondary | ICD-10-CM | POA: Diagnosis not present

## 2017-08-11 DIAGNOSIS — Z0289 Encounter for other administrative examinations: Secondary | ICD-10-CM | POA: Diagnosis not present

## 2017-08-11 DIAGNOSIS — H6123 Impacted cerumen, bilateral: Secondary | ICD-10-CM | POA: Diagnosis not present

## 2017-08-11 DIAGNOSIS — R05 Cough: Secondary | ICD-10-CM | POA: Diagnosis not present

## 2017-08-23 DIAGNOSIS — Z0289 Encounter for other administrative examinations: Secondary | ICD-10-CM | POA: Diagnosis not present

## 2017-08-23 DIAGNOSIS — Z7189 Other specified counseling: Secondary | ICD-10-CM | POA: Diagnosis not present

## 2017-08-23 DIAGNOSIS — H6123 Impacted cerumen, bilateral: Secondary | ICD-10-CM | POA: Diagnosis not present

## 2017-08-23 DIAGNOSIS — Z6829 Body mass index (BMI) 29.0-29.9, adult: Secondary | ICD-10-CM | POA: Diagnosis not present

## 2017-08-23 DIAGNOSIS — J309 Allergic rhinitis, unspecified: Secondary | ICD-10-CM | POA: Diagnosis not present

## 2017-08-23 DIAGNOSIS — I10 Essential (primary) hypertension: Secondary | ICD-10-CM | POA: Diagnosis not present

## 2017-09-06 DIAGNOSIS — Z6828 Body mass index (BMI) 28.0-28.9, adult: Secondary | ICD-10-CM | POA: Diagnosis not present

## 2017-09-06 DIAGNOSIS — H6123 Impacted cerumen, bilateral: Secondary | ICD-10-CM | POA: Diagnosis not present

## 2017-09-27 DIAGNOSIS — Z0289 Encounter for other administrative examinations: Secondary | ICD-10-CM | POA: Diagnosis not present

## 2017-09-27 DIAGNOSIS — Z6828 Body mass index (BMI) 28.0-28.9, adult: Secondary | ICD-10-CM | POA: Diagnosis not present

## 2017-09-27 DIAGNOSIS — E782 Mixed hyperlipidemia: Secondary | ICD-10-CM | POA: Diagnosis not present

## 2017-09-27 DIAGNOSIS — E663 Overweight: Secondary | ICD-10-CM | POA: Diagnosis not present

## 2017-09-27 DIAGNOSIS — I10 Essential (primary) hypertension: Secondary | ICD-10-CM | POA: Diagnosis not present
